# Patient Record
Sex: Male | Born: 1950 | Race: White | Hispanic: No | Marital: Married | Smoking: Never smoker
Health system: Southern US, Community
[De-identification: ages and names within clinical notes are randomized; demographics above are authoritative.]

## PROBLEM LIST (undated history)

## (undated) DIAGNOSIS — L409 Psoriasis, unspecified: Secondary | ICD-10-CM

## (undated) DIAGNOSIS — N210 Calculus in bladder: Secondary | ICD-10-CM

## (undated) DIAGNOSIS — F419 Anxiety disorder, unspecified: Secondary | ICD-10-CM

## (undated) HISTORY — PX: OTHER SURGICAL HISTORY: SHX169

## (undated) SURGERY — Surgical Case
Anesthesia: *Unknown

---

## 1996-09-12 HISTORY — PX: LUMBAR LAMINECTOMY: SHX95

## 1999-04-02 ENCOUNTER — Encounter: Payer: Self-pay | Admitting: Neurological Surgery

## 1999-04-06 ENCOUNTER — Encounter: Payer: Self-pay | Admitting: Neurological Surgery

## 1999-04-06 ENCOUNTER — Ambulatory Visit (HOSPITAL_COMMUNITY): Admission: RE | Admit: 1999-04-06 | Discharge: 1999-04-07 | Payer: Self-pay | Admitting: Neurological Surgery

## 1999-09-13 HISTORY — PX: CERVICAL FUSION: SHX112

## 2000-07-26 ENCOUNTER — Emergency Department (HOSPITAL_COMMUNITY): Admission: EM | Admit: 2000-07-26 | Discharge: 2000-07-26 | Payer: Self-pay | Admitting: Emergency Medicine

## 2000-12-04 ENCOUNTER — Encounter: Payer: Self-pay | Admitting: Neurological Surgery

## 2000-12-04 ENCOUNTER — Observation Stay (HOSPITAL_COMMUNITY): Admission: RE | Admit: 2000-12-04 | Discharge: 2000-12-04 | Payer: Self-pay | Admitting: Neurological Surgery

## 2000-12-21 ENCOUNTER — Encounter: Payer: Self-pay | Admitting: Neurological Surgery

## 2000-12-21 ENCOUNTER — Encounter: Admission: RE | Admit: 2000-12-21 | Discharge: 2000-12-21 | Payer: Self-pay | Admitting: Neurological Surgery

## 2001-01-24 ENCOUNTER — Encounter: Admission: RE | Admit: 2001-01-24 | Discharge: 2001-01-24 | Payer: Self-pay | Admitting: Internal Medicine

## 2001-01-24 ENCOUNTER — Encounter: Payer: Self-pay | Admitting: Internal Medicine

## 2005-01-28 ENCOUNTER — Encounter: Admission: RE | Admit: 2005-01-28 | Discharge: 2005-01-28 | Payer: Self-pay | Admitting: Internal Medicine

## 2007-07-12 ENCOUNTER — Emergency Department (HOSPITAL_COMMUNITY): Admission: EM | Admit: 2007-07-12 | Discharge: 2007-07-12 | Payer: Self-pay | Admitting: Emergency Medicine

## 2007-07-15 ENCOUNTER — Emergency Department (HOSPITAL_COMMUNITY): Admission: EM | Admit: 2007-07-15 | Discharge: 2007-07-15 | Payer: Self-pay | Admitting: Family Medicine

## 2007-09-13 HISTORY — PX: ROTATOR CUFF REPAIR: SHX139

## 2008-08-05 ENCOUNTER — Ambulatory Visit: Payer: Self-pay | Admitting: Internal Medicine

## 2008-08-20 ENCOUNTER — Ambulatory Visit: Payer: Self-pay | Admitting: Internal Medicine

## 2008-08-20 ENCOUNTER — Encounter: Payer: Self-pay | Admitting: Internal Medicine

## 2008-08-21 ENCOUNTER — Encounter: Payer: Self-pay | Admitting: Internal Medicine

## 2011-01-28 NOTE — Op Note (Signed)
[place]. [facility]  Patient:    Jeffrey Rosario, Jeffrey Rosario                    MRN: [id number] Proc. Date: 12/04/00 Adm. Date:  [id number] Disc. 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  OPERATION PERFORMED:  Anterior cervical diskectomy and arthrodesis, C6-C7, Synthes fixation, removal of previously placed C4 to C7 Synthes hardware.  SURGEON:  Stefani Dama, M.D.  ANESTHESIA:  General endotracheal.  INDICATIONS FOR PROCEDURE:  The patient is a 60 year old individual who has had significant neck, shoulder and left arm pain.  He has a herniated nucleus pulposus and spondylitic disease at C6-7 that has developed over a two-year period after he underwent anterior diskectomy and arthrodesis from C4 to C7. The patient has not responded to conservative management.  DESCRIPTION OF PROCEDURE:  The patient was brought to the operating room supine on the stretcher.  After smooth induction of general endotracheal anesthesia, he was placed in 5 pounds of halter traction.  The neck was shaved, prepped and draped in a sterile fashion.  A transverse incision was made at the base of his neck overlying C6-7 area and the dissection was carried down through the platysma.  The plane between the sternocleidomastoid and the strap muscles was dissected bluntly until the prevertebral space was reached.  The first identifiable disk space was noted to be that of C6-7 at the lower edge of the plate.  The plate was then dissected free and the six locking screws were removed.  The screws into the vertebrae were then removed and the plate was removed.  The soft tissues were checked for hemostasis and once this was  ascertained, the C6-7 area was addressed with removal of the disk anteriorly after removal of some ventral osteophytes with a Leksell rongeur.  The disk space was entered and opened fully with a 15 blade and then a combination of Kerrison rongeurs was used to evacuate the disk space until the posterior longitudinal ligament was reached.  Here the Anspach drill and a 2.3 mm dissecting tool were used to remove some small osteophytes and particularly prominent left-sided uncinate spurs were removed.  Some fragments of disk were identified to be herniated both on the left side and on the right side at the C6-7 level.  Once these were decompressed, the foramina were widely opened.  Hemostasis was achieved from the epidural venous area with some pledgets of Gelfoam soaked in thrombin which were later removed.  An 8 mm round fibular graft was placed in the interspace after being packed with some of the patients own autologous bone from the bone spurs.  Hemostasis was then checked in the soft tissues and lateral recesses and then a 22 mm Synthes plate was affixed to the ventral aspect of the vertebral bodies with four locking 4 x 14 mm screws.  Radiographic confirmation of placement of the hardware was obtained and once hemostasis was obtained in the soft tissues and the area was copiously irrigated with antibiotic irrigating solution, the platysma was closed with 3-0 Vicryl suture in interrupted fashion and the subcuticular tissue was closed with 3-0 Vicryl also.  The  patient tolerated the procedure well and was returned to the recovery room in stable condition. Blood loss less than 100 cc. DD:  12/04/00 TD:  12/05/00 Job: [id number]

## 2011-06-21 LAB — POCT URINALYSIS DIP (DEVICE)
Nitrite: NEGATIVE
Protein, ur: 30 — AB
Specific Gravity, Urine: 1.02
Urobilinogen, UA: 0.2
pH: 5.5

## 2011-06-22 LAB — POCT URINALYSIS DIP (DEVICE)
Nitrite: NEGATIVE
Protein, ur: 100 — AB
Specific Gravity, Urine: 1.02
Urobilinogen, UA: 0.2
pH: 5

## 2012-08-03 ENCOUNTER — Telehealth: Payer: Self-pay

## 2012-08-03 NOTE — Telephone Encounter (Signed)
Message copied by Chrystie Nose on Fri Aug 03, 2012 11:17 AM ------      Message from: Hilarie Fredrickson      Created: Fri Aug 03, 2012 11:00 AM      Regarding: recall colonoscopy       Bonita Quin, I received a call from the patient's primary care physician, Dr. Elmore Guise. He inquiring regarding the proper colonoscopy followup interval. The patient had a tubulovillous adenoma and a family history of colon cancer. The proper recall is at this time (December 2012 actually) as opposed to 5 years as originally recommended. Please send the patient a colonoscopy recall letter and this time. Thank you. Convert this to a phone note for the records.

## 2012-08-03 NOTE — Telephone Encounter (Signed)
Recall letter sent to pt.  

## 2012-08-13 ENCOUNTER — Telehealth: Payer: Self-pay | Admitting: Internal Medicine

## 2012-08-13 NOTE — Telephone Encounter (Signed)
Pt calling to schedule Colon, needs to be called back when Feb schedule opens up. Pt wants earliest AM appt available.

## 2012-08-29 NOTE — Telephone Encounter (Signed)
Pt scheduled for previsit 10/12/12@8am , colon scheduled with Dr. Marina Goodell 10/19/12@8am . Left message on voicemail for pt to call back.

## 2012-08-30 NOTE — Telephone Encounter (Signed)
Pt could not make the 10/19/12 appt. Colon appt moved to 11/09/12@8am . Pt aware of appt dates and times.

## 2012-09-12 HISTORY — PX: COLONOSCOPY: SHX174

## 2012-10-12 ENCOUNTER — Ambulatory Visit (AMBULATORY_SURGERY_CENTER): Payer: 59 | Admitting: *Deleted

## 2012-10-12 ENCOUNTER — Encounter: Payer: Self-pay | Admitting: Internal Medicine

## 2012-10-12 VITALS — Ht 74.0 in | Wt 173.6 lb

## 2012-10-12 DIAGNOSIS — Z800 Family history of malignant neoplasm of digestive organs: Secondary | ICD-10-CM

## 2012-10-12 DIAGNOSIS — Z1211 Encounter for screening for malignant neoplasm of colon: Secondary | ICD-10-CM

## 2012-10-12 MED ORDER — MOVIPREP 100 G PO SOLR: ORAL | Status: DC

## 2012-10-19 ENCOUNTER — Encounter: Payer: Self-pay | Admitting: Internal Medicine

## 2012-11-09 ENCOUNTER — Encounter: Payer: Self-pay | Admitting: Internal Medicine

## 2012-11-09 ENCOUNTER — Ambulatory Visit (AMBULATORY_SURGERY_CENTER): Payer: 59 | Admitting: Internal Medicine

## 2012-11-09 VITALS — BP 118/63 | HR 59 | Temp 96.3°F | Resp 14 | Ht 74.0 in | Wt 173.0 lb

## 2012-11-09 DIAGNOSIS — Z1211 Encounter for screening for malignant neoplasm of colon: Secondary | ICD-10-CM

## 2012-11-09 DIAGNOSIS — Z800 Family history of malignant neoplasm of digestive organs: Secondary | ICD-10-CM

## 2012-11-09 DIAGNOSIS — Z86010 Personal history of colonic polyps: Secondary | ICD-10-CM

## 2012-11-09 MED ORDER — SODIUM CHLORIDE 0.9 % IV SOLN: 500.0000 mL | INTRAVENOUS | Status: DC

## 2012-11-09 NOTE — Op Note (Signed)
[facility] [address] [place], [address]   COLONOSCOPY PROCEDURE REPORT  PATIENT: Jeffrey Rosario, Jeffrey Rosario  MR#: [id number] BIRTHDATE: [birth year] , 61  yrs. old GENDER: Male ENDOSCOPIST: Roxy Cedar, MD REFERRED [id number] Program Recall PROCEDURE DATE:  11/09/2012 PROCEDURE:   Colonoscopy, surveillance ASA CLASS:   Class II INDICATIONS:Patient's immediate family history of colon cancer and Patient's personal history of adenomatous colon polyps; index exam December 2009 with tubulovillous adenoma and adenoma. MEDICATIONS: MAC sedation, administered by CRNA and propofol (Diprivan) 270mg  IV  DESCRIPTION OF PROCEDURE:   After the risks benefits and alternatives of the procedure were thoroughly explained, informed consent was obtained.  A digital rectal exam revealed no abnormalities of the rectum.   The LB CF-[id number]  endoscope was introduced through the anus and advanced to the cecum, which was identified by both the appendix and ileocecal valve. No adverse events experienced.   The quality of the prep was excellent, using MoviPrep  The instrument was then slowly withdrawn as the colon was fully examined.      COLON FINDINGS: A normal appearing cecum, ileocecal valve, and appendiceal orifice were identified.  The ascending, hepatic flexure, transverse, splenic flexure, descending, sigmoid colon and rectum appeared unremarkable.  No polyps or cancers were seen. Retroflexed views revealed internal hemorrhoids. The time to cecum=2 minutes 28 seconds.  Withdrawal time=9 minutes 59 seconds. The scope was withdrawn and the procedure completed. COMPLICATIONS: There were no complications.  ENDOSCOPIC IMPRESSION: Normal colon  RECOMMENDATIONS: Follow up colonoscopy in 5 years   eSigned:  Roxy Cedar, MD 11/09/2012 8:43 AM   cc: Nila Nephew, MD and The Patient

## 2012-11-09 NOTE — Patient Instructions (Addendum)
YOU HAD AN ENDOSCOPIC PROCEDURE TODAY AT THE [facility]: Refer to the procedure report that was given to you for any specific questions about what was found during the examination.  If the procedure report does not answer your questions, please call your gastroenterologist to clarify.  If you requested that your care partner not be given the details of your procedure findings, then the procedure report has been included in a sealed envelope for you to review at your convenience later.  YOU SHOULD EXPECT: Some feelings of bloating in the abdomen. Passage of more gas than usual.  Walking can help get rid of the air that was put into your GI tract during the procedure and reduce the bloating. If you had a lower endoscopy (such as a colonoscopy or flexible sigmoidoscopy) you may notice spotting of blood in your stool or on the toilet paper. If you underwent a bowel prep for your procedure, then you may not have a normal bowel movement for a few days.  DIET: Your first meal following the procedure should be a light meal and then it is ok to progress to your normal diet.  A half-sandwich or bowl of soup is an example of a good first meal.  Heavy or fried foods are harder to digest and may make you feel nauseous or bloated.  Likewise meals heavy in dairy and vegetables can cause extra gas to form and this can also increase the bloating.  Drink plenty of fluids but you should avoid alcoholic beverages for 24 hours.  ACTIVITY: Your care partner should take you home directly after the procedure.  You should plan to take it easy, moving slowly for the rest of the day.  You can resume normal activity the day after the procedure however you should NOT DRIVE or use heavy machinery for 24 hours (because of the sedation medicines used during the test).    SYMPTOMS TO REPORT IMMEDIATELY: A gastroenterologist can be reached at any hour.  During normal business hours, 8:30 AM to 5:00 PM Monday through Friday,  call [phone].  After hours and on weekends, please call the GI answering service at [phone] who will take a message and have the physician on call contact you.   Following lower endoscopy (colonoscopy or flexible sigmoidoscopy):  Excessive amounts of blood in the stool  Significant tenderness or worsening of abdominal pains  Swelling of the abdomen that is new, acute  Fever of 100F or higher  FOLLOW UP: If any biopsies were taken you will be contacted by phone or by letter within the next 1-3 weeks.  Call your gastroenterologist if you have not heard about the biopsies in 3 weeks.  Our staff will call the home number listed on your records the next business day following your procedure to check on you and address any questions or concerns that you may have at that time regarding the information given to you following your procedure. This is a courtesy call and so if there is no answer at the home number and we have not heard from you through the emergency physician on call, we will assume that you have returned to your regular daily activities without incident.  Thank-you for choosing [place] for your healthcare needs.,  SIGNATURES/CONFIDENTIALITY: You and/or your care partner have signed paperwork which will be entered into your electronic medical record.  These signatures attest to the fact that that the information above on your After Visit Summary has been reviewed and is  understood.  Full responsibility of the confidentiality of this discharge information lies with you and/or your care-partner. 

## 2012-11-09 NOTE — Progress Notes (Signed)
Patient did not have preoperative order for IV antibiotic SSI prophylaxis. (G8918)  Patient did not experience any of the following events: a burn prior to discharge; a fall within the facility; wrong site/side/patient/procedure/implant event; or a hospital transfer or hospital admission upon discharge from the facility. (G8907)  

## 2012-11-12 ENCOUNTER — Telehealth: Payer: Self-pay

## 2012-11-12 NOTE — Telephone Encounter (Signed)
  Follow up Call-  Call back number 11/09/2012  Post procedure Call Back phone  # [phone]  Permission to leave phone message Yes     Patient questions:  Do you have a fever, pain , or abdominal swelling? no Pain Score  0 *  Have you tolerated food without any problems? yes  Have you been able to return to your normal activities? yes  Do you have any questions about your discharge instructions: Diet   no Medications  no Follow up visit  no  Do you have questions or concerns about your Care? no  Actions: * If pain score is 4 or above: No action needed, pain <4.   No problems per the pt.  Maw

## 2012-11-13 ENCOUNTER — Telehealth: Payer: Self-pay

## 2012-11-13 NOTE — Telephone Encounter (Signed)
Entered in error

## 2013-01-24 ENCOUNTER — Other Ambulatory Visit: Payer: Self-pay | Admitting: Neurological Surgery

## 2013-01-24 DIAGNOSIS — M47816 Spondylosis without myelopathy or radiculopathy, lumbar region: Secondary | ICD-10-CM

## 2013-01-29 ENCOUNTER — Ambulatory Visit
Admission: RE | Admit: 2013-01-29 | Discharge: 2013-01-29 | Disposition: A | Discharge status: Home / Self Care | Payer: 59 | Source: Ambulatory Visit | Attending: Neurological Surgery | Admitting: Neurological Surgery

## 2013-01-29 DIAGNOSIS — M47816 Spondylosis without myelopathy or radiculopathy, lumbar region: Secondary | ICD-10-CM

## 2013-01-29 MED ORDER — GADOBENATE DIMEGLUMINE 529 MG/ML IV SOLN
16.0000 mL | Freq: Once | INTRAVENOUS | Status: AC | PRN
  Administered 2013-01-29: 16 mL via INTRAVENOUS

## 2013-05-16 ENCOUNTER — Other Ambulatory Visit: Payer: Self-pay | Admitting: Dermatology

## 2014-08-11 ENCOUNTER — Other Ambulatory Visit: Payer: Self-pay | Admitting: Dermatology

## 2014-10-31 ENCOUNTER — Other Ambulatory Visit: Payer: Self-pay | Admitting: Dermatology

## 2015-04-17 ENCOUNTER — Encounter: Payer: Self-pay | Admitting: Internal Medicine

## 2016-04-19 ENCOUNTER — Ambulatory Visit
Admission: RE | Admit: 2016-04-19 | Discharge: 2016-04-19 | Disposition: A | Discharge status: Home / Self Care | Payer: 59 | Source: Ambulatory Visit | Attending: Internal Medicine | Admitting: Internal Medicine

## 2016-04-19 ENCOUNTER — Other Ambulatory Visit: Payer: Self-pay | Admitting: Internal Medicine

## 2016-04-19 DIAGNOSIS — R319 Hematuria, unspecified: Secondary | ICD-10-CM

## 2016-04-19 MED ORDER — IOPAMIDOL (ISOVUE-300) INJECTION 61%: 125.0000 mL | Freq: Once | INTRAVENOUS | Status: DC | PRN

## 2016-06-02 ENCOUNTER — Other Ambulatory Visit: Payer: Self-pay | Admitting: Urology

## 2016-06-02 NOTE — Progress Notes (Signed)
Has pre op appt-  Please put orders in EPIC  thanks

## 2016-06-07 ENCOUNTER — Encounter (HOSPITAL_COMMUNITY): Payer: Self-pay

## 2016-06-07 ENCOUNTER — Encounter (HOSPITAL_COMMUNITY)
Admission: RE | Admit: 2016-06-07 | Discharge: 2016-06-07 | Disposition: A | Discharge status: Home / Self Care | Payer: 59 | Source: Ambulatory Visit | Attending: Urology | Admitting: Urology

## 2016-06-07 DIAGNOSIS — R3915 Urgency of urination: Secondary | ICD-10-CM | POA: Diagnosis not present

## 2016-06-07 DIAGNOSIS — R351 Nocturia: Secondary | ICD-10-CM | POA: Diagnosis not present

## 2016-06-07 DIAGNOSIS — N210 Calculus in bladder: Secondary | ICD-10-CM | POA: Diagnosis not present

## 2016-06-07 DIAGNOSIS — N401 Enlarged prostate with lower urinary tract symptoms: Secondary | ICD-10-CM | POA: Diagnosis not present

## 2016-06-07 HISTORY — DX: Calculus in bladder: N21.0

## 2016-06-07 HISTORY — DX: Anxiety disorder, unspecified: F41.9

## 2016-06-07 LAB — CBC
HEMATOCRIT: 40.2 % (ref 39.0–52.0)
Hemoglobin: 13.6 g/dL (ref 13.0–17.0)
MCH: 30.4 pg (ref 26.0–34.0)
MCHC: 33.8 g/dL (ref 30.0–36.0)
MCV: 89.7 fL (ref 78.0–100.0)
Platelets: 196 10*3/uL (ref 150–400)
RBC: 4.48 MIL/uL (ref 4.22–5.81)
RDW: 12.4 % (ref 11.5–15.5)
WBC: 5 10*3/uL (ref 4.0–10.5)

## 2016-06-07 NOTE — Patient Instructions (Addendum)
Jeffrey Rosario  06/07/2016   Your procedure is scheduled on: Wednesday  06/08/2016  Report to [facility]  Entrance take [place]  elevators to 3rd floor to  [name] at  [phone]  AM.  Call this number if you have problems the morning of surgery [phone]   Remember: ONLY 1 PERSON MAY GO WITH YOU TO SHORT STAY TO GET  READY MORNING OF Jeffrey Rosario.   Do not eat food or drink liquids :After Midnight.     Take these medicines the morning of surgery with A SIP OF WATER: Bupropion (Wellbutrin SR)                                 You may not have any metal on your body including hair pins and              piercings  Do not wear jewelry, make-up, lotions, powders or perfumes, deodorant             Do not wear nail polish.  Do not shave  48 hours prior to surgery.              Men may shave face and neck.   Do not bring valuables to the [facility]. Jeffrey [facility].  Contacts, dentures or bridgework may not be worn into surgery.  Leave suitcase in the car. After surgery it may be brought to your room.     Patients discharged the day of surgery will not be allowed to drive home.  Name and phone number of your driver:  Special Instructions: N/A              Please read over the following fact sheets you were given: _____________________________________________________________________             Jeffrey Rosario - Preparing for Surgery Before surgery, you can play an important role.  Because skin is not sterile, your skin needs to be as free of germs as possible.  You can reduce the number of germs on your skin by washing with CHG (chlorahexidine gluconate) soap before surgery.  CHG is an antiseptic cleaner which kills germs and bonds with the skin to continue killing germs even after washing. Please DO NOT use if you have an allergy to CHG or antibacterial soaps.  If your skin becomes reddened/irritated stop using  the CHG and inform your nurse when you arrive at Short Stay. Do not shave (including legs and underarms) for at least 48 hours prior to the first CHG shower.  You may shave your face/neck. Please follow these instructions carefully:  1.  Shower with CHG Soap the night before surgery and the  morning of Surgery.  2.  If you choose to wash your hair, wash your hair first as usual with your  normal  shampoo.  3.  After you shampoo, rinse your hair and body thoroughly to remove the  shampoo.                           4.  Use CHG as you would any other liquid soap.  You can apply chg directly  to the skin and wash  Gently with a scrungie or clean washcloth.  5.  Apply the CHG Soap to your body ONLY FROM THE NECK DOWN.   Do not use on face/ open                           Wound or open sores. Avoid contact with eyes, ears mouth and genitals (private parts).                       Wash face,  Genitals (private parts) with your normal soap.             6.  Wash thoroughly, paying special attention to the area where your surgery  will be performed.  7.  Thoroughly rinse your body with warm water from the neck down.  8.  DO NOT shower/wash with your normal soap after using and rinsing off  the CHG Soap.                9.  Pat yourself dry with a clean towel.            10.  Wear clean pajamas.            11.  Place clean sheets on your bed the night of your first shower and do not  sleep with pets. Day of Surgery : Do not apply any lotions/deodorants the morning of surgery.  Please wear clean clothes to the [facility]/surgery center.  FAILURE TO FOLLOW THESE INSTRUCTIONS MAY RESULT IN THE CANCELLATION OF YOUR SURGERY PATIENT SIGNATURE_________________________________  NURSE SIGNATURE__________________________________  ________________________________________________________________________

## 2016-06-08 ENCOUNTER — Encounter (HOSPITAL_COMMUNITY)
Admission: RE | Disposition: A | Discharge status: Home / Self Care | Payer: Self-pay | Source: Ambulatory Visit | Attending: Urology

## 2016-06-08 ENCOUNTER — Ambulatory Visit (HOSPITAL_COMMUNITY)
Admission: RE | Admit: 2016-06-08 | Discharge: 2016-06-08 | Disposition: A | Discharge status: Home / Self Care | Payer: 59 | Source: Ambulatory Visit | Attending: Urology | Admitting: Urology

## 2016-06-08 ENCOUNTER — Ambulatory Visit (HOSPITAL_COMMUNITY): Payer: 59 | Admitting: Registered Nurse

## 2016-06-08 ENCOUNTER — Encounter (HOSPITAL_COMMUNITY): Payer: Self-pay | Admitting: *Deleted

## 2016-06-08 DIAGNOSIS — N210 Calculus in bladder: Secondary | ICD-10-CM | POA: Insufficient documentation

## 2016-06-08 DIAGNOSIS — N401 Enlarged prostate with lower urinary tract symptoms: Secondary | ICD-10-CM | POA: Insufficient documentation

## 2016-06-08 DIAGNOSIS — R351 Nocturia: Secondary | ICD-10-CM | POA: Insufficient documentation

## 2016-06-08 DIAGNOSIS — R3915 Urgency of urination: Secondary | ICD-10-CM | POA: Insufficient documentation

## 2016-06-08 HISTORY — PX: CYSTOSCOPY WITH LITHOLAPAXY: SHX1425

## 2016-06-08 HISTORY — PX: HOLMIUM LASER APPLICATION: SHX5852

## 2016-06-08 SURGERY — CYSTOSCOPY, WITH BLADDER CALCULUS LITHOLAPAXY
Anesthesia: General

## 2016-06-08 MED ORDER — ONDANSETRON HCL 4 MG/2ML IJ SOLN
INTRAMUSCULAR | Status: AC
  Filled 2016-06-08: qty 2

## 2016-06-08 MED ORDER — PROPOFOL 10 MG/ML IV BOLUS
INTRAVENOUS | Status: AC
  Filled 2016-06-08: qty 20

## 2016-06-08 MED ORDER — LIDOCAINE 2% (20 MG/ML) 5 ML SYRINGE
INTRAMUSCULAR | Status: DC | PRN
  Administered 2016-06-08: 100 mg via INTRAVENOUS

## 2016-06-08 MED ORDER — GLYCOPYRROLATE 0.2 MG/ML IJ SOLN
INTRAMUSCULAR | Status: DC | PRN
  Administered 2016-06-08: 0.2 mg via INTRAVENOUS

## 2016-06-08 MED ORDER — DEXAMETHASONE SODIUM PHOSPHATE 10 MG/ML IJ SOLN
INTRAMUSCULAR | Status: AC
  Filled 2016-06-08: qty 1

## 2016-06-08 MED ORDER — ATROPINE SULFATE 0.4 MG/ML IJ SOLN
INTRAMUSCULAR | Status: AC
  Filled 2016-06-08: qty 1

## 2016-06-08 MED ORDER — DEXAMETHASONE SODIUM PHOSPHATE 10 MG/ML IJ SOLN
INTRAMUSCULAR | Status: DC | PRN
  Administered 2016-06-08: 10 mg via INTRAVENOUS

## 2016-06-08 MED ORDER — PHENAZOPYRIDINE HCL 200 MG PO TABS: 200.0000 mg | ORAL_TABLET | Freq: Once | ORAL | Status: DC | PRN

## 2016-06-08 MED ORDER — MIDAZOLAM HCL 2 MG/2ML IJ SOLN
INTRAMUSCULAR | Status: AC
  Filled 2016-06-08: qty 2

## 2016-06-08 MED ORDER — LIDOCAINE 2% (20 MG/ML) 5 ML SYRINGE
INTRAMUSCULAR | Status: AC
  Filled 2016-06-08: qty 5

## 2016-06-08 MED ORDER — LIDOCAINE HCL 2 % EX GEL
CUTANEOUS | Status: DC | PRN
  Administered 2016-06-08: 1 via URETHRAL

## 2016-06-08 MED ORDER — TAMSULOSIN HCL 0.4 MG PO CAPS
0.4000 mg | ORAL_CAPSULE | Freq: Once | ORAL | Status: AC | PRN
  Administered 2016-06-08: 0.4 mg via ORAL
  Filled 2016-06-08: qty 1

## 2016-06-08 MED ORDER — CEFAZOLIN SODIUM-DEXTROSE 2-4 GM/100ML-% IV SOLN
INTRAVENOUS | Status: AC
  Filled 2016-06-08: qty 100

## 2016-06-08 MED ORDER — MIDAZOLAM HCL 5 MG/5ML IJ SOLN
INTRAMUSCULAR | Status: DC | PRN
  Administered 2016-06-08: 2 mg via INTRAVENOUS

## 2016-06-08 MED ORDER — HYDROMORPHONE HCL 1 MG/ML IJ SOLN: 0.2500 mg | INTRAMUSCULAR | Status: DC | PRN

## 2016-06-08 MED ORDER — SODIUM CHLORIDE 0.9 % IR SOLN
Status: DC | PRN
  Administered 2016-06-08: 17000 mL

## 2016-06-08 MED ORDER — PROPOFOL 10 MG/ML IV BOLUS
INTRAVENOUS | Status: DC | PRN
  Administered 2016-06-08: 200 mg via INTRAVENOUS

## 2016-06-08 MED ORDER — LACTATED RINGERS IV SOLN
INTRAVENOUS | Status: DC
  Administered 2016-06-08 (×3): via INTRAVENOUS

## 2016-06-08 MED ORDER — FENTANYL CITRATE (PF) 100 MCG/2ML IJ SOLN
INTRAMUSCULAR | Status: DC | PRN
  Administered 2016-06-08: 25 ug via INTRAVENOUS
  Administered 2016-06-08: 50 ug via INTRAVENOUS
  Administered 2016-06-08: 25 ug via INTRAVENOUS

## 2016-06-08 MED ORDER — CEFAZOLIN SODIUM-DEXTROSE 2-4 GM/100ML-% IV SOLN
2.0000 g | INTRAVENOUS | Status: AC
  Administered 2016-06-08: 2 g via INTRAVENOUS

## 2016-06-08 MED ORDER — GLYCOPYRROLATE 0.2 MG/ML IV SOSY
PREFILLED_SYRINGE | INTRAVENOUS | Status: AC
  Filled 2016-06-08: qty 3

## 2016-06-08 MED ORDER — OXYCODONE-ACETAMINOPHEN 5-325 MG PO TABS: 1.0000 | ORAL_TABLET | Freq: Once | ORAL | Status: DC | PRN

## 2016-06-08 MED ORDER — PROMETHAZINE HCL 25 MG/ML IJ SOLN: 6.2500 mg | INTRAMUSCULAR | Status: DC | PRN

## 2016-06-08 MED ORDER — FENTANYL CITRATE (PF) 100 MCG/2ML IJ SOLN
INTRAMUSCULAR | Status: AC
  Filled 2016-06-08: qty 2

## 2016-06-08 MED ORDER — ONDANSETRON HCL 4 MG/2ML IJ SOLN
INTRAMUSCULAR | Status: DC | PRN
  Administered 2016-06-08: 4 mg via INTRAVENOUS

## 2016-06-08 MED ORDER — LIDOCAINE HCL 2 % EX GEL
CUTANEOUS | Status: AC
  Filled 2016-06-08: qty 5

## 2016-06-08 SURGICAL SUPPLY — 15 items
BAG URO CATCHER STRL LF (MISCELLANEOUS) ×2 IMPLANT
CARTRIDGE STONEBREAK CO2 KIDNE (ELECTROSURGICAL) ×1 IMPLANT
CLOTH BEACON ORANGE TIMEOUT ST (SAFETY) ×2 IMPLANT
FIBER LASER FLEXIVA 1000 (UROLOGICAL SUPPLIES) ×1 IMPLANT
FIBER LASER FLEXIVA 365 (UROLOGICAL SUPPLIES) IMPLANT
FIBER LASER FLEXIVA 550 (UROLOGICAL SUPPLIES) IMPLANT
FIBER LASER TRAC TIP (UROLOGICAL SUPPLIES) IMPLANT
GLOVE BIOGEL M STRL SZ7.5 (GLOVE) ×2 IMPLANT
GOWN STRL REUS W/ TWL XL LVL3 (GOWN DISPOSABLE) ×1 IMPLANT
GOWN STRL REUS W/TWL XL LVL3 (GOWN DISPOSABLE) ×4 IMPLANT
MANIFOLD NEPTUNE II (INSTRUMENTS) ×2 IMPLANT
PACK CYSTO (CUSTOM PROCEDURE TRAY) ×2 IMPLANT
PROBE PNEUMATIC 1.6MM (ELECTROSURGICAL) ×1 IMPLANT
SYRINGE IRR TOOMEY STRL 70CC (SYRINGE) IMPLANT
TUBING CONNECTING 10 (TUBING) IMPLANT

## 2016-06-08 NOTE — Progress Notes (Signed)
[facility], speaking with Ashok Croon who states Dr. Louis Meckel not in office however his RN will call his cell phone to provide patient update and return call.

## 2016-06-08 NOTE — Interval H&P Note (Signed)
History and Physical Interval Note:  06/08/2016 10:27 AM  Jeffrey Rosario  has presented today for surgery, with the diagnosis of BLADDER STONES  The various methods of treatment have been discussed with the patient and family. After consideration of risks, benefits and other options for treatment, the patient has consented to  Procedure(s): CYSTOSCOPY WITH LITHOLAPAXY (N/A) HOLMIUM LASER APPLICATION (N/A) as a surgical intervention .  The patient's history has been reviewed, patient examined, no change in status, stable for surgery.  I have reviewed the patient's chart and labs.  Questions were answered to the patient's satisfaction.     Louis Meckel W

## 2016-06-08 NOTE — Progress Notes (Signed)
Spoke with Dr. Louis Meckel who gave verbal order for I/O cath to empty bladder, then discharge to home.  Advise patient to have Fosamax prescription filled today and take 1 tablet after having filled.  Instructed  patient to return to ER if unable to void tonight and call [facility] office in the morning with an update.  I/O catheter with 632mls removed.  Patient and wife verbalized understanding to take Fosamax and contact [facility] in the morning with update.  Patient and wife also verbalized understanding if patient unable to void to return to ER.

## 2016-06-08 NOTE — Progress Notes (Signed)
Patient up in room. Has attempted to urinate w/o success. Bloody drainage noted at tip of penis. Continues to drink pos. IVF reconnected and flomax given.

## 2016-06-08 NOTE — Anesthesia Postprocedure Evaluation (Signed)
Anesthesia Post Note  Patient: Jeffrey Rosario  Procedure(s) Performed: Procedure(s) (LRB): CYSTOSCOPY WITH LITHOLAPAXY (N/A) HOLMIUM LASER APPLICATION (N/A)  Patient location during evaluation: PACU Anesthesia Type: General Level of consciousness: awake Pain management: pain level controlled Vital Signs Assessment: post-procedure vital signs reviewed and stable Respiratory status: spontaneous breathing Cardiovascular status: stable Anesthetic complications: no    Last Vitals:  Vitals:   06/08/16 1227 06/08/16 1237  BP: 98/76 130/74  Pulse:  61  Resp:  16  Temp:  36.6 C    Last Pain:  Vitals:   06/08/16 1215  TempSrc:   PainSc: 0-No pain                 EDWARDS,Amina Menchaca

## 2016-06-08 NOTE — Progress Notes (Signed)
[facility], Dr. Louis Meckel not in office, [facility] Dr. Louis Meckel no longer in OR/PACU.  Paged Dr. Louis Meckel again.  Awaiting returned call.  Patient able to void small amount bloody urine.  PVR 625ml.  Patient continues to void small amounts of urine.

## 2016-06-08 NOTE — Op Note (Signed)
Preoperative Diagnosis: Bladder calculi, enlarged prostate  Postoperative Diagnosis:  Same  Procedure(s) Performed:   1. Cystourethroscopy 2. Cystolithalopaxy >2.5cm  Teaching Surgeon:  Louis Meckel, MD; Matilde Sprang, MD  Resident Surgeon:  Sharmaine Base, MD  Assistant(s):  None  Anesthesia:  General  Fluids:  See anesthesia record  Estimated blood loss:  <10 mL  Specimens:  Multiple bladder calculi stone fragments for analysis  Cultures:  None  Drains:  None  Complications:  None  Indications: 65yo male recently seen by Dr. Jeffie Pollock in the office for BPH, bladder calculi and gross hematuria. He presents today for cystolithalopaxy. He was counseled on the risks and benefits of the procedure and wishes to proceed.  Findings: Normal anterior urethroscopy. BPH with intravesical median lobe, high riding bladder neck.  Multiple smooth round light brown/yellow stones within bladder, successfully evacuated.  Description:  The patient was correctly identified in the preop holding area where written informed consent as well potential risk and complication reviewed. He agreed. They were brought back to the operative suite where a preinduction timeout was performed. Once correct information was verified, general anesthesia was induced via LMA. They were then gently placed into dorsal lithotomy position with SCDs in place for VTE prophylaxis. They were prepped and draped in the usual sterile fashion and given appropriate preoperative antibiotics. A second timeout was then performed.   We inserted a 31F rigid cystoscope per urethra with copious lubrication and normal saline irrigation running. This demonstrated findings as described above.  There were otherwise no bladder tumors or mucosal irregularities noted.  We switched to the 26Fr resectoscope for better visualization and irrigant flow. None of the multiple stones within the bladder were able to simply evacuated by bladder emptying through the  resectoscope. Using a combination of the 1000 micron holmium laser fiber with settings of 0.5J and 50Hz  initially (raised to 1J and 50Hz ) and the StoneBreaker Pneumatic Lithotripter, all stones were fragmented that could be emptied from the bladder with bladder filling & evacuation as well as with the To[place]y syringe. All stone fragments were successfully treated and evacuated from the bladder. There was minimal oozing from the bladder neck throughout the case. The bladder was left with minimal irrigant for stimulus to urinate postoperatively and all instrumentation was removed. Viscous lidocaine jelly was instilled in the urethra. He was woken up from anesthesia and taken to recovery for postoperative care.    Post Op Plan:   1. Discharge patient home when meets PACU criteria and voids x1. 2. Discharge with prescriptions for ultram & pyridium PRN. Start flomax 0.4mg  qd. 3. RTC to see Dr. Jeffie Pollock in about 2 weeks

## 2016-06-08 NOTE — Progress Notes (Signed)
Patient unable to urinate.  Bladder scan reveals 756ml.  Dr Louis Meckel paged.

## 2016-06-08 NOTE — Anesthesia Preprocedure Evaluation (Addendum)
Anesthesia Evaluation  Patient identified by MRN, date of birth, ID band Patient awake    Reviewed: Allergy & Precautions, NPO status , Patient's Chart, lab work & pertinent test results  Airway Mallampati: II  TM Distance: >3 FB     Dental   Pulmonary neg pulmonary ROS,    breath sounds clear to auscultation       Cardiovascular negative cardio ROS   Rhythm:Regular Rate:Normal     Neuro/Psych    GI/Hepatic negative GI ROS, Neg liver ROS,   Endo/Other  negative endocrine ROS  Renal/GU negative Renal ROS     Musculoskeletal   Abdominal   Peds  Hematology negative hematology ROS (+)   Anesthesia Other Findings   Reproductive/Obstetrics                             Anesthesia Physical Anesthesia Plan  ASA: III  Anesthesia Plan: General   Post-op Pain Management:    Induction: Intravenous  Airway Management Planned: LMA  Additional Equipment:   Intra-op Plan:   Post-operative Plan: Extubation in OR  Informed Consent:   Dental advisory given  Plan Discussed with: CRNA and Anesthesiologist  Anesthesia Plan Comments:        Anesthesia Quick Evaluation

## 2016-06-08 NOTE — Discharge Instructions (Signed)
General Anesthesia, Adult, Care After Refer to this sheet in the next few weeks. These instructions provide you with information on caring for yourself after your procedure. Your health care provider may also give you more specific instructions. Your treatment has been planned according to current medical practices, but problems sometimes occur. Call your health care provider if you have any problems or questions after your procedure. WHAT TO EXPECT AFTER THE PROCEDURE After the procedure, it is typical to experience: Sleepiness. Nausea and vomiting. HOME CARE INSTRUCTIONS For the first 24 hours after general anesthesia: Have a responsible person with you. Do not drive a car. If you are alone, do not take public transportation. Do not drink alcohol. Do not take medicine that has not been prescribed by your health care provider. Do not sign important papers or make important decisions. You may resume a normal diet and activities as directed by your health care provi If you have questions or problems that seem related to general anesthesia, call the hospital and ask for the anesthetist or anesthesiologist on call. SEEK MEDICAL CARE IF: You have nausea and vomiting that continue the day after anesthesia. You develop a rash. SEEK IMMEDIATE MEDICAL CARE IF:  You have difficulty breathing. You have chest pain. You have any allergic problems.   This information is not intended to replace advice given to you by your health care provider. Make sure you discuss any questions you have with your health care provider.   Document Released: 12/05/2000 Document Revised: 09/19/2014 Document Reviewed: 12/28/2011 Elsevier Interactive Patient Education 2016 [place].   Cystoscopy, Care After Refer to this sheet in the next few weeks. These instructions provide you with information on caring for yourself after your procedure. Your caregiver may also give you more specific instructions. Your treatment  has been planned according to current medical practices, but problems sometimes occur. Call your caregiver if you have any problems or questions after your procedure. HOME CARE INSTRUCTIONS  Things you can do to ease any discomfort after your procedure include:  Drinking enough water and fluids to keep your urine clear or pale yellow.  Taking a warm bath to relieve any burning feelings. SEEK IMMEDIATE MEDICAL CARE IF:   You have an increase in blood in your urine.  You notice blood clots in your urine.  You have difficulty passing urine.  You have the chills.  You have abdominal pain.  You have a fever or persistent symptoms for more than 2-3 days.  You have a fever and your symptoms suddenly get worse. MAKE SURE YOU:   Understand these instructions.  Will watch your condition.  Will get help right away if you are not doing well or get worse.   This information is not intended to replace advice given to you by your health care provider. Make sure you discuss any questions you have with your health care provider.   Document Released: 03/18/2005 Document Revised: 09/19/2014 Document Reviewed: 02/20/2012 Elsevier Interactive Patient Education [employer].

## 2016-06-08 NOTE — H&P (Signed)
Jeffrey Rosario is a 65 year-old male patient who was referred by Dr. Lance Muss, MD who is here for bladder stones.  His problem was diagnosed approximately 03/12/2016. His symptoms include blood in urine. Patient denies having flank pain, back pain, groin pain, nausea, vomiting, fever, and chills.   Jeffrey Rosario is a former patient with a history of stones who I last saw in 2008. He is sent back by Dr Zada Girt for bladder stones found on evaluation of gross hematuria. He has multiple stones with the largest about 68mm. He has BPH with some intravesical extension. He has constant urgency. He has no hesitancy. He has an adequate stream. He will have terminal dribbling. He has nocturia x 2-3. He doesn't feel he empties well. He has passed stones previously.      ALLERGIES: No Allergies    MEDICATIONS: Wellbutrin XL 150 MG Oral Tablet Extended Release 24 Hour Oral     GU PSH: None     PSH Notes: Shoulder Surgery, Back Surgery, Lower Back Surgery, Knee Surgery   NON-GU PSH: Back Surgery (Unspecified) Neck Surgery (Unspecified) Shoulder Surgery (Unspecified)    GU PMH: Bladder Stone, Bladder calculus - 2014 Hematuria, Unspec, Hematuria - 2014 Kidney Stone, Kidney stone on left side - 2014 Left lower quadrant pain, Abdominal pain, LLQ (left lower quadrant) - 2014      PMH Notes:  1898-09-12 00:00:00 - Note: Normal Routine History And Physical Adult  2007-07-16 09:47:46 - Note: Arthritis   NON-GU PMH: Personal history of other mental and behavioral disorders, History of depression - 2014 Tinea unguium, Dermatophytosis Of The Nails - 2014    FAMILY HISTORY: 1 Daughter - Other 2 sons - Other Colon Cancer - Father Death In The Family Father - Father Family Health Status Number - Father nephrolithiasis - Brother   SOCIAL HISTORY: Marital Status: Married Current Smoking Status: Patient has never smoked.  Drinks 1 drink per day.      Notes: Alcohol Use, Occupation:, Tobacco Use,  Caffeine Use, Marital History - Currently Married   REVIEW OF SYSTEMS:    GU Review Male:   Patient reports frequent urination, hard to postpone urination, and get up at night to urinate. Patient denies burning/ pain with urination, leakage of urine, stream starts and stops, trouble starting your stream, have to strain to urinate , erection problems, and penile pain.  Gastrointestinal (Upper):   Patient denies nausea, indigestion/ heartburn, and vomiting.  Gastrointestinal (Lower):   Patient denies diarrhea and constipation.  Constitutional:   Patient denies fever, night sweats, weight loss, and fatigue.  Skin:   Patient denies skin rash/ lesion and itching.  Eyes:   Patient denies blurred vision and double vision.  Ears/ Nose/ Throat:   Patient denies sore throat and sinus problems.  Hematologic/Lymphatic:   Patient denies swollen glands and easy bruising.  Cardiovascular:   Patient denies leg swelling and chest pains.  Respiratory:   Patient denies cough and shortness of breath.  Endocrine:   Patient denies excessive thirst.  Musculoskeletal:   Patient denies back pain and joint pain.  Neurological:   Patient reports headaches. Patient denies dizziness.  Psychologic:   Patient denies depression and anxiety.   VITAL SIGNS:      06/01/2016 02:04 PM  Weight 172 lb / 78.02 kg  Height 72 in / 182.88 cm  BP 156/84 mmHg  Heart Rate 84 /min  BMI 23.3 kg/m   GU PHYSICAL EXAMINATION:    Anus and Perineum: No  hemorrhoids. No anal stenosis. No rectal fissure, no anal fissure. No edema, no dimple, no perineal tenderness, no anal tenderness.  Scrotum: No lesions. No edema. No cysts. No warts.  Epididymides: Right: no spermatocele, no masses, no cysts, no tenderness, no induration, no enlargement. Left: no spermatocele, no masses, no cysts, no tenderness, no induration, no enlargement.  Testes: No tenderness, no swelling, no enlargement left testes. No tenderness, no swelling, no enlargement right  testes. Normal location left testes. Normal location right testes. No mass, no cyst, no varicocele, no hydrocele left testes. No mass, no cyst, no varicocele, no hydrocele right testes.  Urethral Meatus: Normal size. No lesion, no wart, no discharge, no polyp. Normal location.  Penis: Circumcised, no warts, no cracks. No dorsal Peyronie's plaques, no left corporal Peyronie's plaques, no right corporal Peyronie's plaques, no scarring, no warts. No balanitis, no meatal stenosis.  Prostate: Prostate 2 + size. Left lobe normal consistency, right lobe normal consistency. Symmetrical lobes. No prostate nodule. Left lobe no tenderness, right lobe no tenderness.   Seminal Vesicles: Nonpalpable.  Sphincter Tone: Normal sphincter. No rectal tenderness. No rectal mass.    MULTI-SYSTEM PHYSICAL EXAMINATION:    Constitutional: Well-nourished. No physical deformities. Normally developed. Good grooming.  Neck: Neck symmetrical, not swollen. Normal tracheal position.  Respiratory: No labored breathing, no use of accessory muscles. CTA  Cardiovascular: Normal temperature, no edema. RRR without murmur.  Lymphatic: No enlargement of neck, axillae, groin.  Skin: No paleness, no jaundice, no cyanosis. No lesion, no ulcer, no rash.  Neurologic / Psychiatric: Oriented to time, oriented to place, oriented to person. No depression, no anxiety, no agitation.  Gastrointestinal: No hernia. No mass, no tenderness, no rigidity, non obese abdomen.   Musculoskeletal: Normal gait and station of head and neck.     PAST DATA REVIEWED:  Source Of History:  Patient  Lab Test Review:   PSA  Urine Test Review:   Urinalysis  Urodynamics Review:   Review Bladder Scan   PROCEDURES:           PVR Ultrasound - [id number]  Scanned Volume: 64 cc         Urinalysis w/Scope - 81001 Dipstick Dipstick Cont'd Micro  Specimen: Voided Bilirubin: Neg WBC/hpf: 0-5/hpf  Color: Amber Ketones: Neg RBC/hpf: >60/hpf  Appearance: Cloudy Blood:  3+ Bacteria: Rare  Specific Gravity: 1.020 Protein: Trace Cystals: Amorph Urates  pH: 5.0 Urobilinogen: 0.2 Casts: NS (Not Seen)  Glucose: Neg Nitrites: Neg Trichomonas: Not Present    Leukocyte Esterase: Neg Mucous: Not Present      Epithelial Cells: 0-5/hpf      Yeast: NS (Not Seen)      Sperm: Not Present    ASSESSMENT:      ICD-10 Details  1 GU:   Bladder Stone - N21.0 He has multiple bladder stones up to 28mm in size with associated hematuria and irritative voiding symptoms.   2   Gross hematuria - R31.0   3   BPH w/LUTS - N40.1 He has prostate enlargement with intravesical extension but was voiding well until the hematuria began. His PVR is ok.   4   Urinary Urgency - R39.15    PLAN:           Orders Labs Urine Culture and Sensitivity          Schedule Return Visit: ASAP - Schedule Surgery  Procedure: 06/01/2016 at [facility] - [phone] - PVR Ultrasound (Bladder Scan / Residual Urine) - [phone]  Procedure: Unspecified Date - Cysto Bladder Stone >2.5cm [id number] Notes: Next available          Document Letter(s):  Created for Patient: Clinical Summary         Notes:   I discussed the options for therapy including cystolithalopaxy with or without TUR/Laser prostate procedure.   I reviewed the risks of the cystolithalopaxy including bleeding, infection, injury to the bladder and adjacent structures, recurrent stones, thrombotic events and anesthetic complications.   I reviewed the additional risks of the TURP/Laser prostatectomy including incontinence, strictures, fluid overload, ejaculatory dysfuntion and erectile dysfunction.   After reviewing the options, he would like to proceed with cystolithalopaxy at this time and will get that scheduled.

## 2016-06-08 NOTE — Transfer of Care (Signed)
Immediate Anesthesia Transfer of Care Note  Patient: Jeffrey Rosario  Procedure(s) Performed: Procedure(s): CYSTOSCOPY WITH LITHOLAPAXY (N/A) HOLMIUM LASER APPLICATION (N/A)  Patient Location: PACU  Anesthesia Type:General  Level of Consciousness:  sedated, patient cooperative and responds to stimulation  Airway & Oxygen Therapy:Patient Spontanous Breathing and Patient connected to face mask oxgen  Post-op Assessment:  Report given to PACU RN and Post -op Vital signs reviewed and stable  Post vital signs:  Reviewed and stable  Last Vitals:  Vitals:   06/08/16 1001  BP: (!) 155/80  Pulse: 70  Resp: 16  Temp: [id number] C    Complications: No apparent anesthesia complications

## 2016-06-08 NOTE — Anesthesia Procedure Notes (Signed)
Procedure Name: LMA Insertion Date/Time: 06/08/2016 10:57 AM Performed by: Talbot Grumbling Pre-anesthesia Checklist: Patient identified, Emergency Drugs available, Suction available and Patient being monitored Patient Re-evaluated:Patient Re-evaluated prior to inductionOxygen Delivery Method: Circle system utilized Preoxygenation: Pre-oxygenation with 100% oxygen Intubation Type: IV induction Ventilation: Mask ventilation without difficulty LMA: LMA inserted LMA Size: 4.0 Number of attempts: 1 Placement Confirmation: positive ETCO2 and breath sounds checked- equal and bilateral Tube secured with: Tape Dental Injury: Teeth and Oropharynx as per pre-operative assessment

## 2016-06-09 ENCOUNTER — Encounter (HOSPITAL_COMMUNITY): Payer: Self-pay | Admitting: Urology

## 2017-09-12 HISTORY — PX: CATARACT EXTRACTION, BILATERAL: SHX1313

## 2017-11-01 ENCOUNTER — Encounter: Payer: Self-pay | Admitting: Internal Medicine

## 2018-01-12 ENCOUNTER — Ambulatory Visit (AMBULATORY_SURGERY_CENTER): Payer: Self-pay | Admitting: *Deleted

## 2018-01-12 ENCOUNTER — Other Ambulatory Visit: Payer: Self-pay

## 2018-01-12 VITALS — Ht 73.0 in | Wt 173.0 lb

## 2018-01-12 DIAGNOSIS — Z800 Family history of malignant neoplasm of digestive organs: Secondary | ICD-10-CM

## 2018-01-12 DIAGNOSIS — Z86010 Personal history of colonic polyps: Secondary | ICD-10-CM

## 2018-01-12 MED ORDER — NA SULFATE-K SULFATE-MG SULF 17.5-3.13-1.6 GM/177ML PO SOLN: ORAL | 0 refills | Status: DC

## 2018-01-12 NOTE — Progress Notes (Signed)
Patient denies any allergies to eggs or soy. Patient denies any problems with anesthesia/sedation. Patient denies any oxygen use at home. Patient denies taking any diet/weight loss medications or blood thinners. EMMI education declined by the pt. suprep coupon given to pt.

## 2018-01-16 ENCOUNTER — Encounter: Payer: Self-pay | Admitting: Internal Medicine

## 2018-01-24 ENCOUNTER — Encounter: Payer: Self-pay | Admitting: Internal Medicine

## 2018-01-24 ENCOUNTER — Ambulatory Visit (AMBULATORY_SURGERY_CENTER): Payer: 59 | Admitting: Internal Medicine

## 2018-01-24 ENCOUNTER — Other Ambulatory Visit: Payer: Self-pay

## 2018-01-24 VITALS — BP 113/83 | HR 50 | Temp 97.5°F | Resp 11 | Ht 73.0 in | Wt 173.0 lb

## 2018-01-24 DIAGNOSIS — D123 Benign neoplasm of transverse colon: Secondary | ICD-10-CM

## 2018-01-24 DIAGNOSIS — D122 Benign neoplasm of ascending colon: Secondary | ICD-10-CM

## 2018-01-24 DIAGNOSIS — Z86010 Personal history of colonic polyps: Secondary | ICD-10-CM

## 2018-01-24 MED ORDER — SODIUM CHLORIDE 0.9 % IV SOLN: 500.0000 mL | Freq: Once | INTRAVENOUS | Status: DC

## 2018-01-24 NOTE — Progress Notes (Signed)
Report given to PACU, vss 

## 2018-01-24 NOTE — Progress Notes (Signed)
Pt's states no medical or surgical changes since previsit or office visit.  No egg or soy allergy  

## 2018-01-24 NOTE — Op Note (Signed)
[place] Patient Name: Jeffrey Rosario Procedure Date: 01/24/2018 9:19 AM MRN: [id number] Endoscopist: Docia Chuck. Henrene Pastor , MD Age: 67 Referring MD:  Date of Birth: 02-20-1951 Gender: Male Account #: [id number] Procedure:                Colonoscopy, With cold snare polypectomy x 2 Indications:              High risk colon cancer surveillance: Personal                            history of adenoma with villous component, High                            risk colon cancer surveillance: Personal history of                            non-advanced adenoma. Previous examinations 2004,                            2009, 2014. Father with colon cancer greater than                            age 75 Medicines:                Monitored Anesthesia Care Procedure:                Pre-Anesthesia Assessment:                           - Prior to the procedure, a History and Physical                            was performed, and patient medications and                            allergies were reviewed. The patient's tolerance of                            previous anesthesia was also reviewed. The risks                            and benefits of the procedure and the sedation                            options and risks were discussed with the patient.                            All questions were answered, and informed consent                            was obtained. Prior Anticoagulants: The patient has                            taken no previous anticoagulant or antiplatelet  agents. ASA Grade Assessment: II - A patient with                            mild systemic disease. After reviewing the risks                            and benefits, the patient was deemed in                            satisfactory condition to undergo the procedure.                           After obtaining informed consent, the colonoscope                            was passed under direct  vision. Throughout the                            procedure, the patient's blood pressure, pulse, and                            oxygen saturations were monitored continuously. The                            Colonoscope was introduced through the anus and                            advanced to the the cecum, identified by                            appendiceal orifice and ileocecal valve. The                            ileocecal valve, appendiceal orifice, and rectum                            were photographed. The quality of the bowel                            preparation was excellent. The colonoscopy was                            performed without difficulty. The patient tolerated                            the procedure well. The bowel preparation used was                            SUPREP. Scope In: 9:33:04 AM Scope Out: 9:47:55 AM Scope Withdrawal Time: 0 hours 12 minutes 59 seconds  Total Procedure Duration: 0 hours 14 minutes 51 seconds  Findings:                 Two polyps were found in the hepatic flexure and  proximal ascending colon. The polyps were 2 to 6 mm                            in size. These polyps were removed with a cold                            snare. Resection and retrieval were complete.                           Internal hemorrhoids were found during retroflexion.                           The exam was otherwise without abnormality on                            direct and retroflexion views. Complications:            No immediate complications. Estimated blood loss:                            None. Estimated Blood Loss:     Estimated blood loss: none. Impression:               - Two 2 to 6 mm polyps at the hepatic flexure and                            in the proximal ascending colon, removed with a                            cold snare. Resected and retrieved.                           - Internal hemorrhoids.                            - The examination was otherwise normal on direct                            and retroflexion views. Recommendation:           - Repeat colonoscopy in 5 years for surveillance.                           - Patient has a contact number available for                            emergencies. The signs and symptoms of potential                            delayed complications were discussed with the                            patient. Return to normal activities tomorrow.  Written discharge instructions were provided to the                            patient.                           - Resume previous diet.                           - Continue present medications.                           - Await pathology results. Docia Chuck. Henrene Pastor, MD 01/24/2018 10:00:43 AM This report has been signed electronically.

## 2018-01-24 NOTE — Patient Instructions (Signed)
Discharge instructions given. Handouts on polyps and hemorrhoids. Resume previous medications. YOU HAD AN ENDOSCOPIC PROCEDURE TODAY AT THE [facility]:   Refer to the procedure report that was given to you for any specific questions about what was found during the examination.  If the procedure report does not answer your questions, please call your gastroenterologist to clarify.  If you requested that your care partner not be given the details of your procedure findings, then the procedure report has been included in a sealed envelope for you to review at your convenience later.  YOU SHOULD EXPECT: Some feelings of bloating in the abdomen. Passage of more gas than usual.  Walking can help get rid of the air that was put into your GI tract during the procedure and reduce the bloating. If you had a lower endoscopy (such as a colonoscopy or flexible sigmoidoscopy) you may notice spotting of blood in your stool or on the toilet paper. If you underwent a bowel prep for your procedure, you may not have a normal bowel movement for a few days.  Please Note:  You might notice some irritation and congestion in your nose or some drainage.  This is from the oxygen used during your procedure.  There is no need for concern and it should clear up in a day or so.  SYMPTOMS TO REPORT IMMEDIATELY:   Following lower endoscopy (colonoscopy or flexible sigmoidoscopy):  Excessive amounts of blood in the stool  Significant tenderness or worsening of abdominal pains  Swelling of the abdomen that is new, acute  Fever of 100F or higher   For urgent or emergent issues, a gastroenterologist can be reached at any hour by calling [phone].   DIET:  We do recommend a small meal at first, but then you may proceed to your regular diet.  Drink plenty of fluids but you should avoid alcoholic beverages for 24 hours.  ACTIVITY:  You should plan to take it easy for the rest of today and you should NOT DRIVE  or use heavy machinery until tomorrow (because of the sedation medicines used during the test).    FOLLOW UP: Our staff will call the number listed on your records the next business day following your procedure to check on you and address any questions or concerns that you may have regarding the information given to you following your procedure. If we do not reach you, we will leave a message.  However, if you are feeling well and you are not experiencing any problems, there is no need to return our call.  We will assume that you have returned to your regular daily activities without incident.  If any biopsies were taken you will be contacted by phone or by letter within the next 1-3 weeks.  Please call us at [phone] if you have not heard about the biopsies in 3 weeks.    SIGNATURES/CONFIDENTIALITY: You and/or your care partner have signed paperwork which will be entered into your electronic medical record.  These signatures attest to the fact that that the information above on your After Visit Summary has been reviewed and is understood.  Full responsibility of the confidentiality of this discharge information lies with you and/or your care-partner. 

## 2018-01-24 NOTE — Progress Notes (Signed)
Called to room to assist during endoscopic procedure.  Patient ID and intended procedure confirmed with present staff. Received instructions for my participation in the procedure from the performing physician.  

## 2018-01-25 ENCOUNTER — Telehealth: Payer: Self-pay | Admitting: *Deleted

## 2018-01-25 NOTE — Telephone Encounter (Signed)
  Follow up Call-  Call back number 01/24/2018  Post procedure Call Back phone  # [phone]  Permission to leave phone message Yes  Some recent data might be hidden     Patient questions:  Message left to call us if necessary.  [place]  [place] and the phone is just VM. No need to recall.

## 2018-01-29 ENCOUNTER — Encounter: Payer: Self-pay | Admitting: Internal Medicine

## 2019-05-28 ENCOUNTER — Other Ambulatory Visit: Payer: Self-pay | Admitting: Urology

## 2019-06-05 ENCOUNTER — Other Ambulatory Visit: Payer: Self-pay

## 2019-06-05 ENCOUNTER — Encounter (HOSPITAL_BASED_OUTPATIENT_CLINIC_OR_DEPARTMENT_OTHER): Payer: Self-pay | Admitting: *Deleted

## 2019-06-05 NOTE — Progress Notes (Signed)
Spoke with patient via telephone for pre op interview. NPO after MN. No medications AM of surgery. Arrival time 0530.  

## 2019-06-06 ENCOUNTER — Other Ambulatory Visit (HOSPITAL_COMMUNITY)
Admission: RE | Admit: 2019-06-06 | Discharge: 2019-06-06 | Disposition: A | Discharge status: Home / Self Care | Payer: Medicare HMO | Source: Ambulatory Visit | Attending: Urology | Admitting: Urology

## 2019-06-06 DIAGNOSIS — Z01812 Encounter for preprocedural laboratory examination: Secondary | ICD-10-CM | POA: Insufficient documentation

## 2019-06-06 DIAGNOSIS — Z20828 Contact with and (suspected) exposure to other viral communicable diseases: Secondary | ICD-10-CM | POA: Diagnosis not present

## 2019-06-07 LAB — NOVEL CORONAVIRUS, NAA (HOSP ORDER, SEND-OUT TO REF LAB; TAT 18-24 HRS): SARS-CoV-2, NAA: NOT DETECTED

## 2019-06-09 NOTE — Anesthesia Preprocedure Evaluation (Addendum)
Anesthesia Evaluation  Patient identified by MRN, date of birth, ID band Patient awake    Reviewed: Allergy & Precautions, NPO status , Patient's Chart, lab work & pertinent test results  Airway Mallampati: II  TM Distance: >3 FB Neck ROM: Full    Dental no notable dental hx.    Pulmonary neg pulmonary ROS,    Pulmonary exam normal breath sounds clear to auscultation       Cardiovascular negative cardio ROS Normal cardiovascular exam Rhythm:Regular Rate:Normal     Neuro/Psych Anxiety negative neurological ROS     GI/Hepatic negative GI ROS, Neg liver ROS,   Endo/Other  negative endocrine ROS  Renal/GU negative Renal ROS     Musculoskeletal negative musculoskeletal ROS (+)   Abdominal   Peds  Hematology negative hematology ROS (+)   Anesthesia Other Findings BENIGN PROSTATIC HYPERPLASIA, BLADDER STONE  Reproductive/Obstetrics                            Anesthesia Physical Anesthesia Plan  ASA: II  Anesthesia Plan: General   Post-op Pain Management:    Induction: Intravenous  PONV Risk Score and Plan: 3 and Ondansetron, Dexamethasone, Treatment may vary due to age or medical condition and Midazolam  Airway Management Planned: LMA  Additional Equipment:   Intra-op Plan:   Post-operative Plan: Extubation in OR  Informed Consent: I have reviewed the patients History and Physical, chart, labs and discussed the procedure including the risks, benefits and alternatives for the proposed anesthesia with the patient or authorized representative who has indicated his/her understanding and acceptance.     Dental advisory given  Plan Discussed with: CRNA  Anesthesia Plan Comments:       Anesthesia Quick Evaluation

## 2019-06-10 ENCOUNTER — Ambulatory Visit (HOSPITAL_BASED_OUTPATIENT_CLINIC_OR_DEPARTMENT_OTHER): Payer: Medicare HMO | Admitting: Anesthesiology

## 2019-06-10 ENCOUNTER — Encounter (HOSPITAL_BASED_OUTPATIENT_CLINIC_OR_DEPARTMENT_OTHER): Payer: Self-pay

## 2019-06-10 ENCOUNTER — Encounter (HOSPITAL_BASED_OUTPATIENT_CLINIC_OR_DEPARTMENT_OTHER)
Admission: RE | Disposition: A | Discharge status: Home / Self Care | Payer: Self-pay | Source: Home / Self Care | Attending: Urology

## 2019-06-10 ENCOUNTER — Ambulatory Visit (HOSPITAL_BASED_OUTPATIENT_CLINIC_OR_DEPARTMENT_OTHER)
Admission: RE | Admit: 2019-06-10 | Discharge: 2019-06-10 | Disposition: A | Discharge status: Home / Self Care | Payer: Medicare HMO | Attending: Urology | Admitting: Urology

## 2019-06-10 DIAGNOSIS — N401 Enlarged prostate with lower urinary tract symptoms: Secondary | ICD-10-CM | POA: Diagnosis not present

## 2019-06-10 DIAGNOSIS — N210 Calculus in bladder: Secondary | ICD-10-CM | POA: Insufficient documentation

## 2019-06-10 DIAGNOSIS — N138 Other obstructive and reflux uropathy: Secondary | ICD-10-CM | POA: Insufficient documentation

## 2019-06-10 HISTORY — PX: CYSTOSCOPY WITH LITHOLAPAXY: SHX1425

## 2019-06-10 HISTORY — DX: Psoriasis, unspecified: L40.9

## 2019-06-10 HISTORY — PX: CYSTOSCOPY WITH INSERTION OF UROLIFT: SHX6678

## 2019-06-10 HISTORY — PX: HOLMIUM LASER APPLICATION: SHX5852

## 2019-06-10 SURGERY — CYSTOSCOPY, WITH BLADDER CALCULUS LITHOLAPAXY
Anesthesia: General

## 2019-06-10 MED ORDER — LIDOCAINE 2% (20 MG/ML) 5 ML SYRINGE
INTRAMUSCULAR | Status: AC
  Filled 2019-06-10: qty 5

## 2019-06-10 MED ORDER — ACETAMINOPHEN 500 MG PO TABS
ORAL_TABLET | ORAL | Status: AC
  Filled 2019-06-10: qty 2

## 2019-06-10 MED ORDER — LACTATED RINGERS IV SOLN
INTRAVENOUS | Status: DC
  Administered 2019-06-10 (×2): via INTRAVENOUS
  Filled 2019-06-10: qty 1000

## 2019-06-10 MED ORDER — ACETAMINOPHEN 500 MG PO TABS
1000.0000 mg | ORAL_TABLET | Freq: Once | ORAL | Status: AC
  Administered 2019-06-10: 1000 mg via ORAL
  Filled 2019-06-10: qty 2

## 2019-06-10 MED ORDER — GLYCOPYRROLATE PF 0.2 MG/ML IJ SOSY
PREFILLED_SYRINGE | INTRAMUSCULAR | Status: AC
  Filled 2019-06-10: qty 1

## 2019-06-10 MED ORDER — PROPOFOL 10 MG/ML IV BOLUS
INTRAVENOUS | Status: AC
  Filled 2019-06-10: qty 20

## 2019-06-10 MED ORDER — MIDAZOLAM HCL 5 MG/5ML IJ SOLN
INTRAMUSCULAR | Status: DC | PRN
  Administered 2019-06-10: 2 mg via INTRAVENOUS

## 2019-06-10 MED ORDER — KETOROLAC TROMETHAMINE 30 MG/ML IJ SOLN
INTRAMUSCULAR | Status: AC
  Filled 2019-06-10: qty 1

## 2019-06-10 MED ORDER — FENTANYL CITRATE (PF) 100 MCG/2ML IJ SOLN
INTRAMUSCULAR | Status: DC | PRN
  Administered 2019-06-10 (×2): 50 ug via INTRAVENOUS

## 2019-06-10 MED ORDER — FENTANYL CITRATE (PF) 100 MCG/2ML IJ SOLN
25.0000 ug | INTRAMUSCULAR | Status: DC | PRN
  Filled 2019-06-10: qty 1

## 2019-06-10 MED ORDER — MIDAZOLAM HCL 2 MG/2ML IJ SOLN
INTRAMUSCULAR | Status: AC
  Filled 2019-06-10: qty 2

## 2019-06-10 MED ORDER — ONDANSETRON HCL 4 MG/2ML IJ SOLN
INTRAMUSCULAR | Status: AC
  Filled 2019-06-10: qty 2

## 2019-06-10 MED ORDER — FENTANYL CITRATE (PF) 100 MCG/2ML IJ SOLN
INTRAMUSCULAR | Status: AC
  Filled 2019-06-10: qty 2

## 2019-06-10 MED ORDER — LIDOCAINE HCL (CARDIAC) PF 100 MG/5ML IV SOSY
PREFILLED_SYRINGE | INTRAVENOUS | Status: DC | PRN
  Administered 2019-06-10: 80 mg via INTRAVENOUS

## 2019-06-10 MED ORDER — KETOROLAC TROMETHAMINE 15 MG/ML IJ SOLN
15.0000 mg | Freq: Once | INTRAMUSCULAR | Status: DC | PRN
  Filled 2019-06-10: qty 1

## 2019-06-10 MED ORDER — ONDANSETRON HCL 4 MG/2ML IJ SOLN
INTRAMUSCULAR | Status: DC | PRN
  Administered 2019-06-10: 4 mg via INTRAVENOUS

## 2019-06-10 MED ORDER — BELLADONNA ALKALOIDS-OPIUM 16.2-60 MG RE SUPP
RECTAL | Status: AC
  Filled 2019-06-10: qty 1

## 2019-06-10 MED ORDER — CEFAZOLIN SODIUM-DEXTROSE 2-4 GM/100ML-% IV SOLN
2.0000 g | INTRAVENOUS | Status: AC
  Administered 2019-06-10: 2 g via INTRAVENOUS
  Filled 2019-06-10: qty 100

## 2019-06-10 MED ORDER — BELLADONNA ALKALOIDS-OPIUM 16.2-60 MG RE SUPP
RECTAL | Status: DC | PRN
  Administered 2019-06-10: 1 via RECTAL

## 2019-06-10 MED ORDER — GLYCOPYRROLATE 0.2 MG/ML IJ SOLN
INTRAMUSCULAR | Status: DC | PRN
  Administered 2019-06-10: 0.1 mg via INTRAVENOUS

## 2019-06-10 MED ORDER — PROPOFOL 10 MG/ML IV BOLUS
INTRAVENOUS | Status: DC | PRN
  Administered 2019-06-10: 200 mg via INTRAVENOUS

## 2019-06-10 MED ORDER — DEXAMETHASONE SODIUM PHOSPHATE 10 MG/ML IJ SOLN
INTRAMUSCULAR | Status: AC
  Filled 2019-06-10: qty 1

## 2019-06-10 MED ORDER — DEXAMETHASONE SODIUM PHOSPHATE 4 MG/ML IJ SOLN
INTRAMUSCULAR | Status: DC | PRN
  Administered 2019-06-10: 5 mg via INTRAVENOUS

## 2019-06-10 MED ORDER — ONDANSETRON HCL 4 MG/2ML IJ SOLN
4.0000 mg | Freq: Once | INTRAMUSCULAR | Status: DC | PRN
  Filled 2019-06-10: qty 2

## 2019-06-10 MED ORDER — CEFAZOLIN SODIUM-DEXTROSE 2-4 GM/100ML-% IV SOLN
INTRAVENOUS | Status: AC
  Filled 2019-06-10: qty 100

## 2019-06-10 MED ORDER — CEPHALEXIN 500 MG PO CAPS: 500.0000 mg | ORAL_CAPSULE | Freq: Two times a day (BID) | ORAL | 0 refills | Status: DC

## 2019-06-10 SURGICAL SUPPLY — 27 items
BAG DRAIN URO-CYSTO SKYTR STRL (DRAIN) ×2 IMPLANT
BAG DRN UROCATH (DRAIN) ×1
BAG URINE DRAINAGE (UROLOGICAL SUPPLIES) ×2 IMPLANT
BAG URINE LEG 500ML (DRAIN) ×3 IMPLANT
BLADE SURG 15 STRL LF DISP TIS (BLADE) IMPLANT
BLADE SURG 15 STRL SS (BLADE) ×2
CATH FOLEY 2WAY SLVR  5CC 18FR (CATHETERS) ×1
CATH FOLEY 2WAY SLVR 5CC 18FR (CATHETERS) IMPLANT
CLOTH BEACON ORANGE TIMEOUT ST (SAFETY) ×4 IMPLANT
EVACUATOR MICROVAS BLADDER (UROLOGICAL SUPPLIES) ×1 IMPLANT
FIBER LASER FLEXIVA 1000 (UROLOGICAL SUPPLIES) ×1 IMPLANT
FIBER LASER FLEXIVA 200 (UROLOGICAL SUPPLIES) IMPLANT
FIBER LASER FLEXIVA 365 (UROLOGICAL SUPPLIES) IMPLANT
FIBER LASER FLEXIVA 550 (UROLOGICAL SUPPLIES) IMPLANT
GLOVE BIO SURGEON STRL SZ8 (GLOVE) ×2 IMPLANT
GOWN STRL REUS W/TWL XL LVL3 (GOWN DISPOSABLE) ×2 IMPLANT
IV NS IRRIG 3000ML ARTHROMATIC (IV SOLUTION) ×4 IMPLANT
KIT TURNOVER CYSTO (KITS) ×2 IMPLANT
MANIFOLD NEPTUNE II (INSTRUMENTS) ×2 IMPLANT
NS IRRIG 1000ML POUR BTL (IV SOLUTION) ×1 IMPLANT
NS IRRIG 500ML POUR BTL (IV SOLUTION) ×1 IMPLANT
PACK CYSTO (CUSTOM PROCEDURE TRAY) ×2 IMPLANT
SYSTEM UROLIFT (Male Continence) ×3 IMPLANT
TUBE CONNECTING 12X1/4 (SUCTIONS) ×2 IMPLANT
TUBING UROLOGY SET (TUBING) ×1 IMPLANT
WATER STERILE IRR 3000ML UROMA (IV SOLUTION) ×4 IMPLANT
WATER STERILE IRR 500ML POUR (IV SOLUTION) IMPLANT

## 2019-06-10 NOTE — Discharge Instructions (Signed)
1. You may see some blood in the urine and may have some burning with urination for 48-72 hours. You also may notice that you have to urinate more frequently or urgently after your procedure which is normal.  °2. You should call should you develop an inability urinate, fever > 101, persistent nausea and vomiting that prevents you from eating or drinking to stay hydrated.  °3. If you have a catheter, you will be taught how to take care of the catheter by the nursing staff prior to discharge from the hospital.  You may periodically feel a strong urge to void with the catheter in place.  This is a bladder spasm and most often can occur when having a bowel movement or moving around. It is typically self-limited and usually will stop after a few minutes.  You may use some Vaseline or Neosporin around the tip of the catheter to reduce friction at the tip of the penis. You may also see some blood in the urine.  A very small amount of blood can make the urine look quite red.  As long as the catheter is draining well, there usually is not a problem.  However, if the catheter is not draining well and is bloody, you should call the office ([phone]) to notify us. ° ° °Post Anesthesia Home Care Instructions ° °Activity: °Get plenty of rest for the remainder of the day. A responsible individual must stay with you for 24 hours following the procedure.  °For the next 24 hours, DO NOT: °-Drive a car °-Operate machinery °-Drink alcoholic beverages °-Take any medication unless instructed by your physician °-Make any legal decisions or sign important papers. ° °Meals: °Start with liquid foods such as gelatin or soup. Progress to regular foods as tolerated. Avoid greasy, spicy, heavy foods. If nausea and/or vomiting occur, drink only clear liquids until the nausea and/or vomiting subsides. Call your physician if vomiting continues. ° °Special Instructions/Symptoms: °Your throat may feel dry or sore from the anesthesia or the  breathing tube placed in your throat during surgery. If this causes discomfort, gargle with warm salt water. The discomfort should disappear within 24 hours. ° ° °   ° °

## 2019-06-10 NOTE — Anesthesia Postprocedure Evaluation (Signed)
Anesthesia Post Note  Patient: Jeffrey Rosario  Procedure(s) Performed: CYSTOSCOPY WITH LITHOLAPAXY (N/A ) CYSTOSCOPY WITH INSERTION OF UROLIFT (N/A ) HOLMIUM LASER APPLICATION (N/A )     Patient location during evaluation: PACU Anesthesia Type: General Level of consciousness: awake and alert Pain management: pain level controlled Vital Signs Assessment: post-procedure vital signs reviewed and stable Respiratory status: spontaneous breathing, nonlabored ventilation, respiratory function stable and patient connected to nasal cannula oxygen Cardiovascular status: blood pressure returned to baseline and stable Postop Assessment: no apparent nausea or vomiting Anesthetic complications: no    Last Vitals:  Vitals:   06/10/19 1000 06/10/19 1015  BP: 130/73 120/75  Pulse: (!) 47 70  Resp: 10 16  Temp:  36.6 C  SpO2: 98% 97%    Last Pain:  Vitals:   06/10/19 1015  TempSrc: Oral  PainSc: 0-No pain                 Ryan P Ellender

## 2019-06-10 NOTE — Anesthesia Procedure Notes (Signed)
Procedure Name: LMA Insertion Date/Time: 06/10/2019 7:48 AM Performed by: Bufford Spikes, CRNA Pre-anesthesia Checklist: Patient identified, Emergency Drugs available, Suction available and Patient being monitored Patient Re-evaluated:Patient Re-evaluated prior to induction Oxygen Delivery Method: Circle system utilized Preoxygenation: Pre-oxygenation with 100% oxygen Induction Type: IV induction Ventilation: Mask ventilation without difficulty LMA: LMA inserted LMA Size: 5.0 Number of attempts: 1 Airway Equipment and Method: Bite block Placement Confirmation: positive ETCO2 Tube secured with: Tape Dental Injury: Teeth and Oropharynx as per pre-operative assessment

## 2019-06-10 NOTE — H&P (Signed)
H&P  Chief Complaint:BPH with bladder stones   History of Present Illness: 68 yo male s/p Urolift implants 07/2018. Recent eval for persistent LUTS evealed free-floating bladder calculi and continued obstruction. He presents for repeat Urolift and cystolithalopaxy.  Past Medical History:  Diagnosis Date  . Anxiety   . Bladder stones   . Psoriasis     Past Surgical History:  Procedure Laterality Date  . CATARACT EXTRACTION, BILATERAL    . CERVICAL FUSION  2001    repeat 2005  . COLONOSCOPY  2014  . CYSTOSCOPY WITH LITHOLAPAXY N/A 06/08/2016   Procedure: CYSTOSCOPY WITH LITHOLAPAXY;  Surgeon: Ardis Hughs, MD;  Location: [facility];  Service: Urology;  Laterality: N/A;  . HOLMIUM LASER APPLICATION N/A [id number]   Procedure: HOLMIUM LASER APPLICATION;  Surgeon: Ardis Hughs, MD;  Location: [facility];  Service: Urology;  Laterality: N/A;  . knee arthrscopy     right 1980, left 1982  . LUMBAR LAMINECTOMY  1998  . ROTATOR CUFF REPAIR  2009   right    Home Medications:   Allergies: No Known Allergies  Family History  Problem Relation Age of Onset  . Colon cancer Father        does not know age of onset=mid 6's  . Colon cancer Paternal Grandfather   . Stomach cancer Neg Hx     Social History:  reports that he has never smoked. He has never used smokeless tobacco. He reports current alcohol use of about 10.0 standard drinks of alcohol per week. He reports that he does not use drugs.  ROS: A complete review of systems was performed.  All systems are negative except for pertinent findings as noted.  Physical Exam:  Vital signs in last 24 hours:   Constitutional:  Alert and oriented, No acute distress Cardiovascular: Regular rate  Respiratory: Normal respiratory effort GI: Abdomen is soft, nontender, nondistended, no abdominal masses. No CVAT.  Genitourinary: Normal male phallus, testes are descended bilaterally and non-tender and without masses, scrotum is normal in  appearance without lesions or masses, perineum is normal on inspection. Lymphatic: No lymphadenopathy Neurologic: Grossly intact, no focal deficits Psychiatric: Normal mood and affect  Laboratory Data:  No results for input(s): WBC, HGB, HCT, PLT in the last 72 hours.  No results for input(s): NA, K, CL, GLUCOSE, BUN, CALCIUM, CREATININE in the last 72 hours.  Invalid input(s): CO3   No results found for this or any previous visit (from the past 24 hour(s)). Recent Results (from the past 240 hour(s))  Novel Coronavirus, NAA (Hosp order, Send-out to Ref Lab; TAT 18-24 hrs     Status: None   Collection Time: 06/06/19  2:54 PM   Specimen: Nasopharyngeal Swab; Respiratory  Result Value Ref Range Status   SARS-CoV-2, NAA NOT DETECTED NOT DETECTED Final    Comment: (NOTE) This nucleic acid amplification test was developed and its performance characteristics determined by [employer]. Nucleic acid amplification tests include PCR and TMA. This test has not been FDA cleared or approved. This test has been authorized by FDA under an Emergency Use Authorization (EUA). This test is only authorized for the duration of time the declaration that circumstances exist justifying the authorization of the emergency use of in vitro diagnostic tests for detection of SARS-CoV-2 virus and/or diagnosis of COVID-19 infection under section 564(b)(1) of the Act, 21 U.S.C. [id number]) (1), unless the authorization is terminated or revoked sooner. When diagnostic testing is negative, the possibility of a false negative result should  be considered in the context of a patient's recent exposures and the presence of clinical signs and symptoms consistent with COVID-19. An individual without symptoms of COVID- 19 and who is not shedding SARS-CoV-2 vi rus would expect to have a negative (not detected) result in this assay. Performed At: [facility] [address] [place], [place]  [id number] Rush Farmer MD [id number]    Smiley  Final    Comment: Performed at [facility], [place] [address], [place], [place] [address]    Renal Function: No results for input(s): CREATININE in the last 168 hours. CrCl cannot be calculated (No successful lab value found.).  Radiologic Imaging: No results found.  Impression/Assessment:  Bladder calculi, BPH w/ ubstruction  Plan:  Repeat Urolift, cystolithalopaxy

## 2019-06-10 NOTE — Interval H&P Note (Signed)
History and Physical Interval Note:  06/10/2019 7:30 AM  Jeffrey Rosario  has presented today for surgery, with the diagnosis of BENIGN PROSTATIC HYPERPLASIA, BLADDER STONE.  The various methods of treatment have been discussed with the patient and family. After consideration of risks, benefits and other options for treatment, the patient has consented to  Procedure(s): CYSTOSCOPY WITH LITHOLAPAXY (N/A) CYSTOSCOPY WITH INSERTION OF UROLIFT (N/A) HOLMIUM LASER APPLICATION (N/A) as a surgical intervention.  The patient's history has been reviewed, patient examined, no change in status, stable for surgery.  I have reviewed the patient's chart and labs.  Questions were answered to the patient's satisfaction.     Lillette Boxer Keita Demarco

## 2019-06-10 NOTE — Transfer of Care (Signed)
Immediate Anesthesia Transfer of Care Note  Patient: Jeffrey Rosario  Procedure(s) Performed: CYSTOSCOPY WITH LITHOLAPAXY (N/A ) CYSTOSCOPY WITH INSERTION OF UROLIFT (N/A ) HOLMIUM LASER APPLICATION (N/A )  Patient Location: PACU  Anesthesia Type:General  Level of Consciousness: awake, alert  and oriented  Airway & Oxygen Therapy: Patient Spontanous Breathing and Patient connected to nasal cannula oxygen  Post-op Assessment: Report given to RN and Post -op Vital signs reviewed and stable  Post vital signs: Reviewed and stable  Last Vitals:  Vitals Value Taken Time  BP    Temp    Pulse    Resp    SpO2      Last Pain:  Vitals:   06/10/19 0637  TempSrc: Oral  PainSc: 0-No pain      Patients Stated Pain Goal: 8 ([id number])  Complications: No apparent anesthesia complications

## 2019-06-11 ENCOUNTER — Encounter (HOSPITAL_BASED_OUTPATIENT_CLINIC_OR_DEPARTMENT_OTHER): Payer: Self-pay | Admitting: Urology

## 2019-06-16 NOTE — Op Note (Signed)
Preoperative diagnosis: BPH, bladder calculi (largest less than 20 mm in size)  Postoperative diagnosis: Same  Principal procedure: Cystoscopy, cystolitholapaxy with holmium laser of bladder calculi, UroLift placement (3 implants)  Surgeon: Patte Winkel  Anesthesia: General with LMA  Complications: None  Specimen: Stone fragments  Drains: 65 French Foley catheter to leg bag  Estimated blood loss: Less than 25 mL  Findings: Minimally obstructive prostate with partially obstructive lateral lobes.  I did not see prior implants which had been placed in the past.  He had 3-4 bladder calculi, the largest of which was under 20 mm in size).  There were no bladder lesions.  Description of procedure: The patient was properly identified in the holding area.  His team to the operating room where general anesthetic was administered with the LMA.  He was placed in the dorsolithotomy position.  Genitalia and perineum were prepped and draped, proper timeout was performed.  52 French panendoscope was placed under direct vision.  Cystoscopy revealed slightly obstructive lateral lobes towards the base of the prostate.  There were no urethral lesions.  His bladder was entered and inspected.  The previously mentioned stones were seen.  The thousand micron laser fiber was then used with the holmium laser power, the stones were fragmented into multiple smaller fragments which were then carefully extracted with direct drainage by placing the scope near the stones or with irrigation using the Microvasive aspirator.  Once all stone fragments were extracted, I then proceeded with the UroLift procedure.  2 implants were placed on the right lateral lobe, at the base.  1 of these implants, despite being properly placed, pulled through.  A third implant was placed at the left prostatic base.  There was excellent anterior channel seen following this.  At this point, 33 [place] Foley catheter was placed, the balloon filled with 10  cc of water and placed to dependent drainage.  Procedure was terminated.  The patient tolerated procedure well and was returned to the PACU in stable condition.

## 2019-09-26 ENCOUNTER — Ambulatory Visit
Admission: RE | Admit: 2019-09-26 | Discharge: 2019-09-26 | Disposition: A | Discharge status: Home / Self Care | Payer: Medicare HMO | Source: Ambulatory Visit | Attending: Internal Medicine | Admitting: Internal Medicine

## 2019-09-26 ENCOUNTER — Other Ambulatory Visit: Payer: Self-pay | Admitting: Internal Medicine

## 2019-09-26 DIAGNOSIS — R634 Abnormal weight loss: Secondary | ICD-10-CM

## 2019-10-01 ENCOUNTER — Ambulatory Visit: Payer: Medicare Other | Attending: Internal Medicine

## 2019-10-01 DIAGNOSIS — Z23 Encounter for immunization: Secondary | ICD-10-CM

## 2019-10-01 NOTE — Progress Notes (Signed)
   Covid-19 Vaccination Clinic  Name:  Jeffrey Rosario    MRN: [id number] DOB: May 28, 1951  10/01/2019  [name] was observed post Covid-19 immunization for 15 minutes without incidence. He was provided with Vaccine Information Sheet and instruction to access the V-Safe system.   [name] was instructed to call 911 with any severe reactions post vaccine: Marland Kitchen Difficulty breathing  . Swelling of your face and throat  . A fast heartbeat  . A bad rash all over your body  . Dizziness and weakness    Immunizations Administered    Name Date Dose VIS Date Route   Pfizer COVID-19 Vaccine 10/01/2019  3:43 PM 0.3 mL 08/23/2019 Intramuscular   Manufacturer: [place]   Lot: [id number]   [place]: [id number]

## 2019-10-21 ENCOUNTER — Ambulatory Visit: Payer: Medicare HMO | Attending: Internal Medicine

## 2019-10-21 DIAGNOSIS — Z23 Encounter for immunization: Secondary | ICD-10-CM | POA: Insufficient documentation

## 2019-10-21 NOTE — Progress Notes (Signed)
   Covid-19 Vaccination Clinic  Name:  Jeffrey Rosario    MRN: [id number] DOB: 08/25/1951  10/21/2019  [name] was observed post Covid-19 immunization for 15 minutes without incidence. He was provided with Vaccine Information Sheet and instruction to access the V-Safe system.   [name] was instructed to call 911 with any severe reactions post vaccine: Marland Kitchen Difficulty breathing  . Swelling of your face and throat  . A fast heartbeat  . A bad rash all over your body  . Dizziness and weakness    Immunizations Administered    Name Date Dose VIS Date Route   Pfizer COVID-19 Vaccine 10/21/2019 10:46 AM 0.3 mL 08/23/2019 Intramuscular   Manufacturer: [place]   Lot: [id number]   [place]: [id number]

## 2020-09-17 IMAGING — CR DG CHEST 2V
2 series · 2 of 2 positions shown · non-contrast
Comparison: 01/28/2005

CLINICAL DATA: Unintentional weight loss

EXAM:
CHEST - 2 VIEW

[w chest pa]
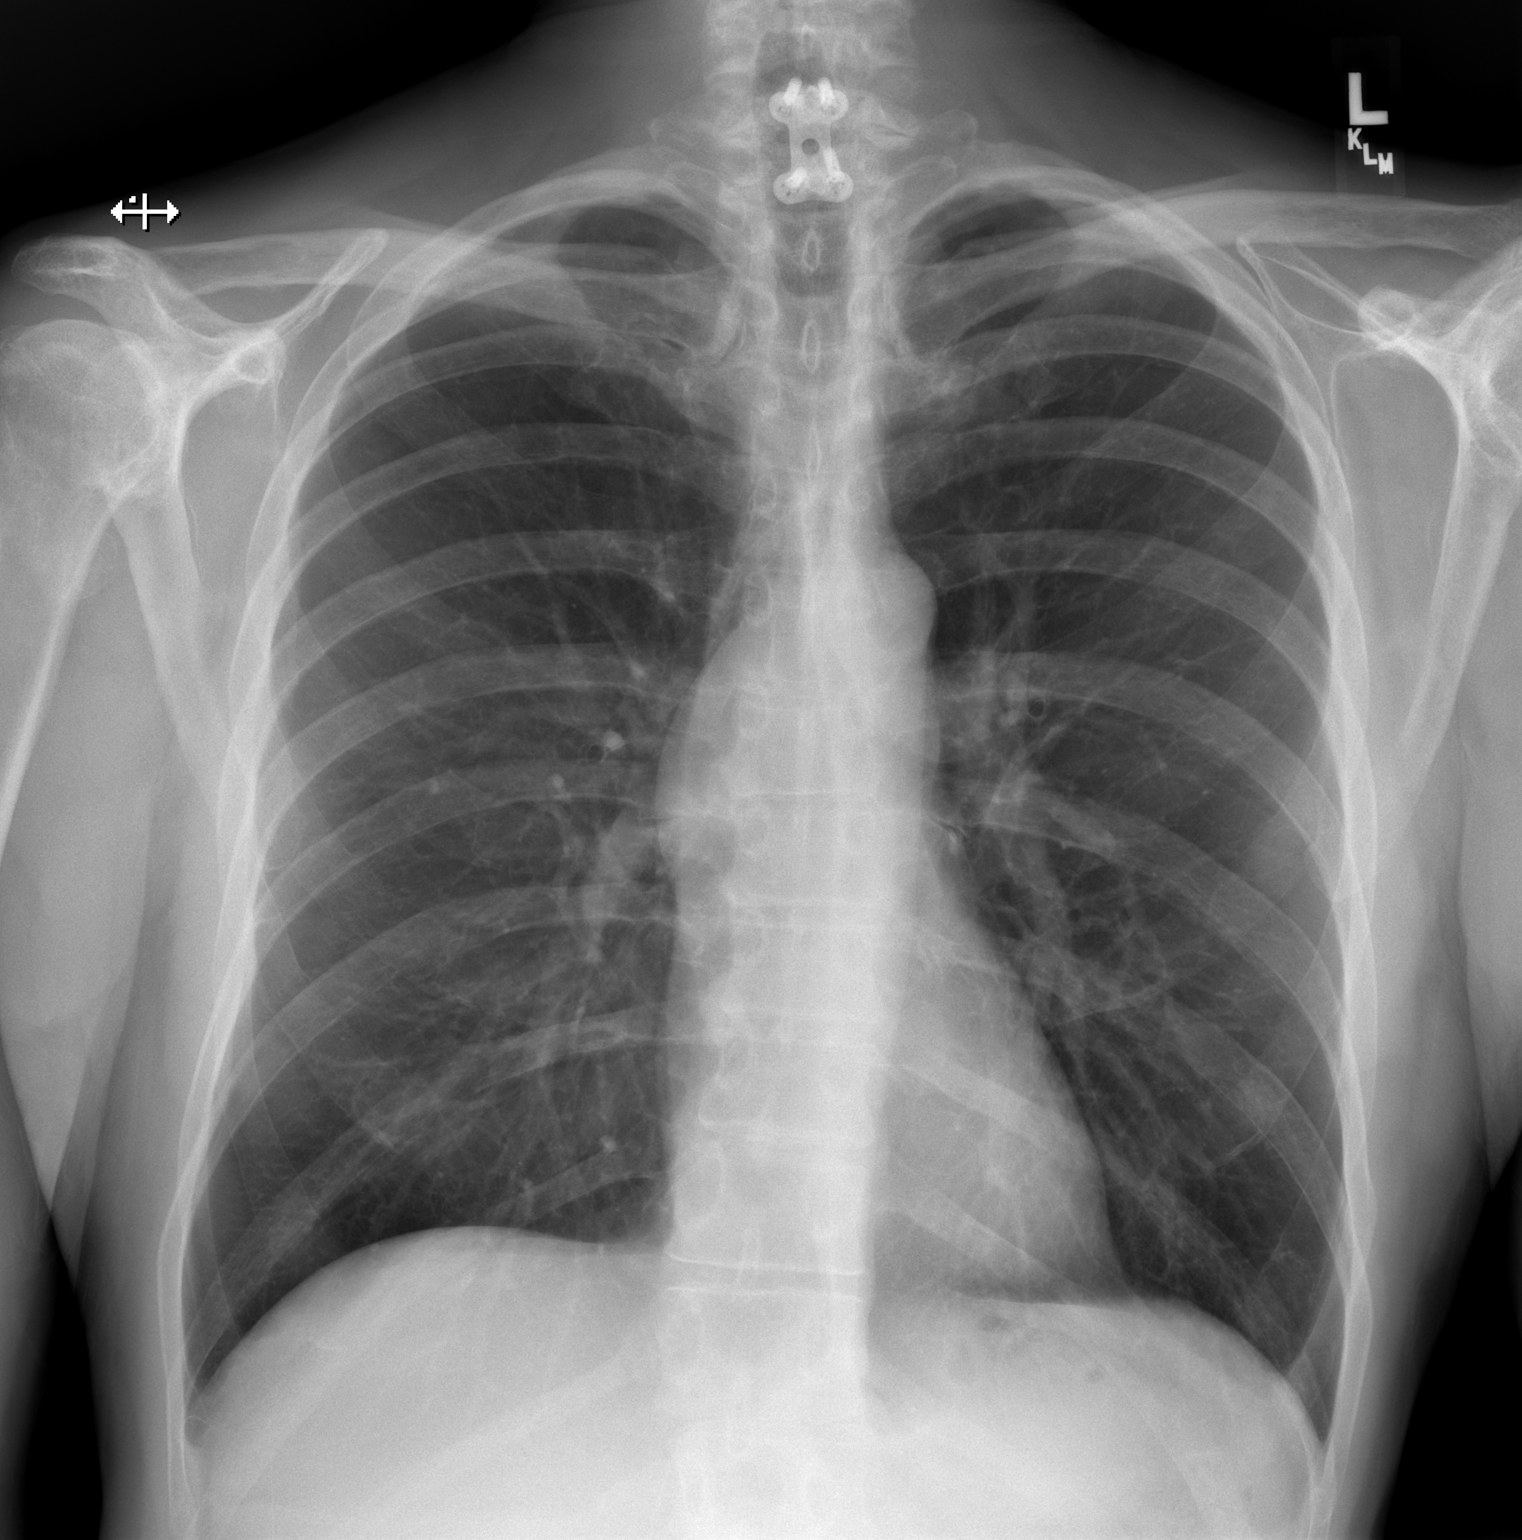

[w chest lat]
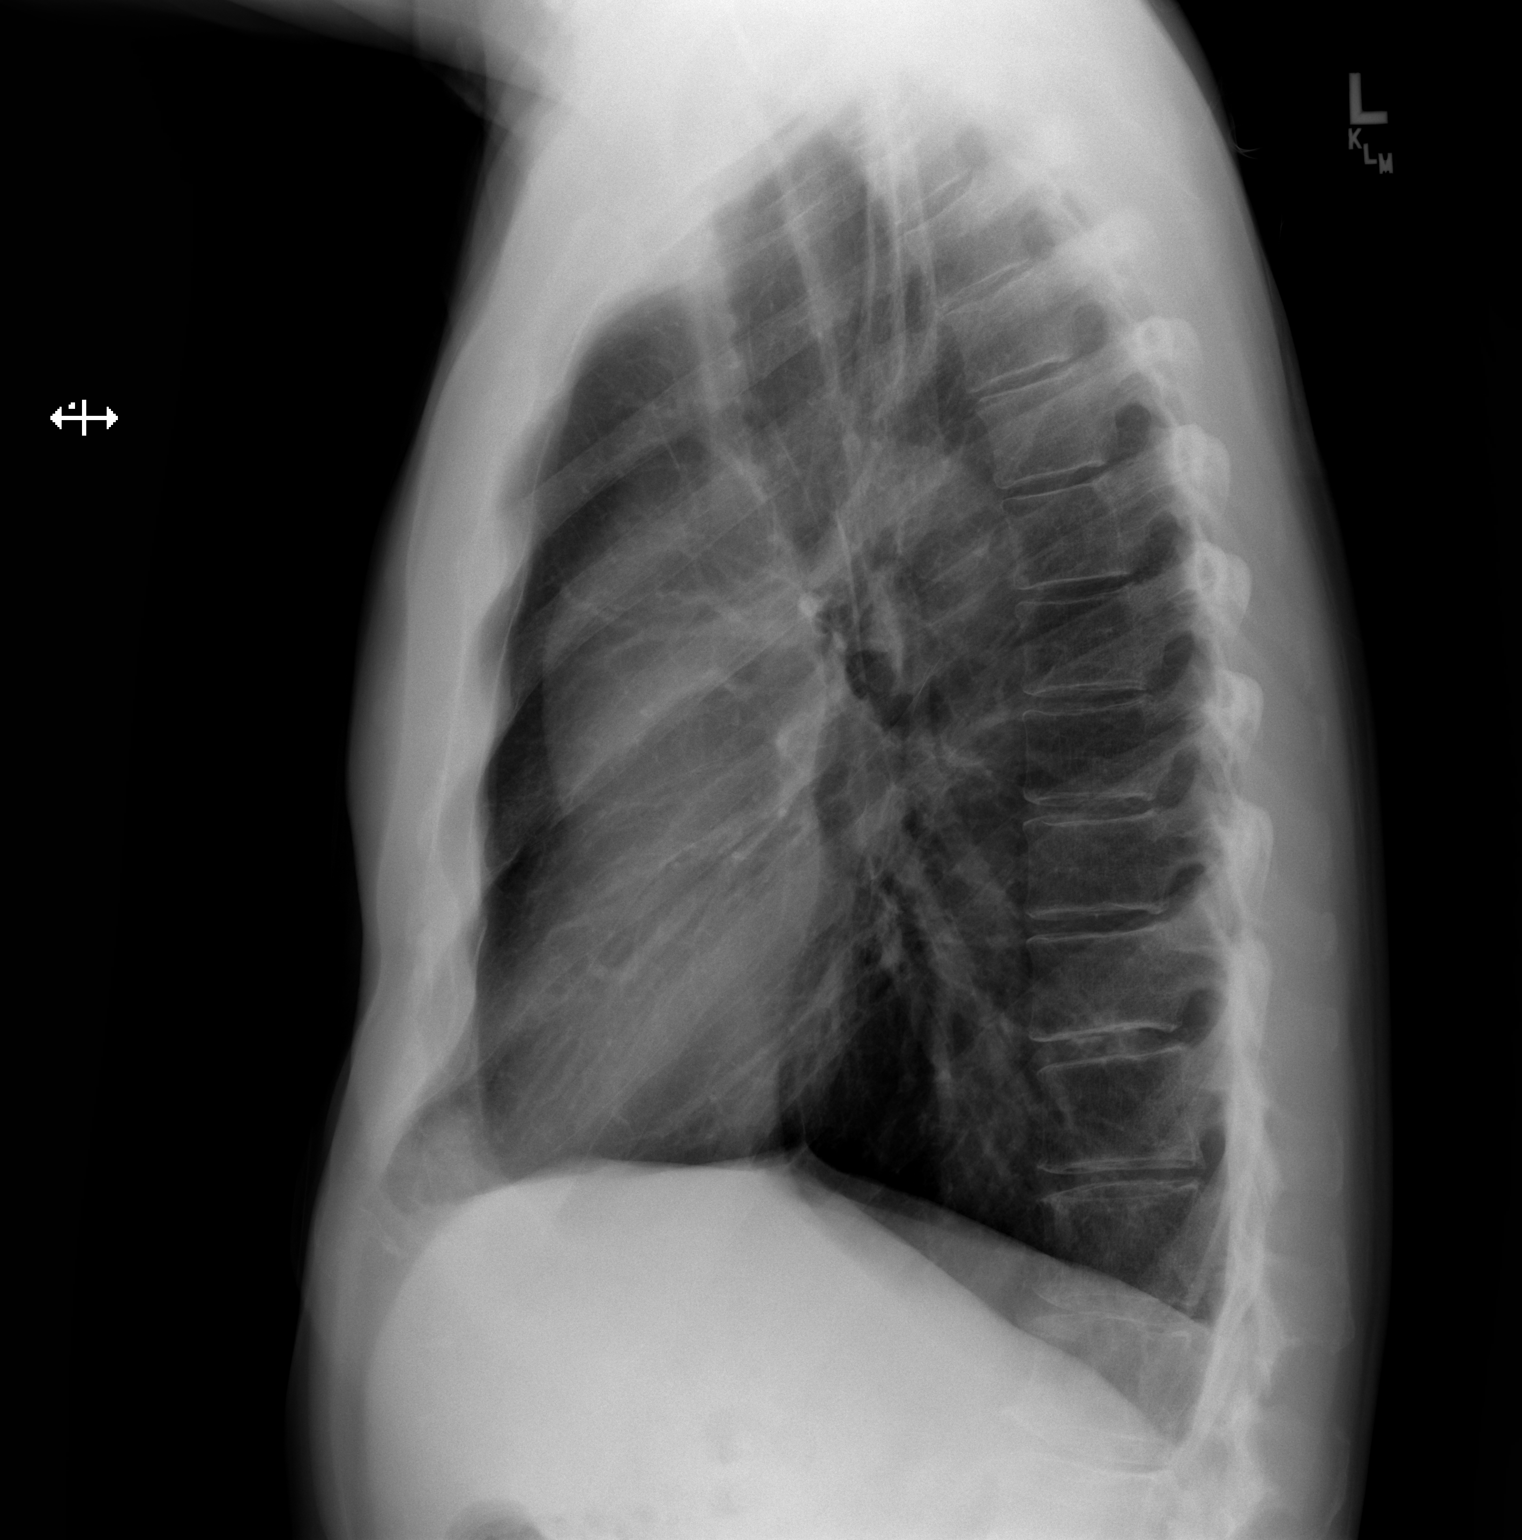

[2 of 2 positions shown; findings below may reference images not displayed]

FINDINGS: The heart size and mediastinal contours are within normal limits.
Both lungs are clear. The visualized skeletal structures are
unremarkable. Cervical fusion plate.
IMPRESSION: No active cardiopulmonary disease.

## 2020-10-13 DIAGNOSIS — M25561 Pain in right knee: Secondary | ICD-10-CM | POA: Diagnosis not present

## 2020-10-13 DIAGNOSIS — Z0100 Encounter for examination of eyes and vision without abnormal findings: Secondary | ICD-10-CM | POA: Diagnosis not present

## 2020-10-13 DIAGNOSIS — M7541 Impingement syndrome of right shoulder: Secondary | ICD-10-CM | POA: Diagnosis not present

## 2020-10-30 DIAGNOSIS — Z0100 Encounter for examination of eyes and vision without abnormal findings: Secondary | ICD-10-CM | POA: Diagnosis not present

## 2020-11-23 DIAGNOSIS — Z0100 Encounter for examination of eyes and vision without abnormal findings: Secondary | ICD-10-CM | POA: Diagnosis not present

## 2020-12-23 DIAGNOSIS — E782 Mixed hyperlipidemia: Secondary | ICD-10-CM | POA: Diagnosis not present

## 2020-12-24 DIAGNOSIS — N401 Enlarged prostate with lower urinary tract symptoms: Secondary | ICD-10-CM | POA: Diagnosis not present

## 2020-12-24 DIAGNOSIS — R350 Frequency of micturition: Secondary | ICD-10-CM | POA: Diagnosis not present

## 2020-12-24 DIAGNOSIS — R310 Gross hematuria: Secondary | ICD-10-CM | POA: Diagnosis not present

## 2020-12-24 DIAGNOSIS — N210 Calculus in bladder: Secondary | ICD-10-CM | POA: Diagnosis not present

## 2020-12-30 DIAGNOSIS — Z0000 Encounter for general adult medical examination without abnormal findings: Secondary | ICD-10-CM | POA: Diagnosis not present

## 2020-12-30 DIAGNOSIS — Z1339 Encounter for screening examination for other mental health and behavioral disorders: Secondary | ICD-10-CM | POA: Diagnosis not present

## 2020-12-30 DIAGNOSIS — E782 Mixed hyperlipidemia: Secondary | ICD-10-CM | POA: Diagnosis not present

## 2020-12-30 DIAGNOSIS — N401 Enlarged prostate with lower urinary tract symptoms: Secondary | ICD-10-CM | POA: Diagnosis not present

## 2020-12-30 DIAGNOSIS — R69 Illness, unspecified: Secondary | ICD-10-CM | POA: Diagnosis not present

## 2020-12-30 DIAGNOSIS — Z86010 Personal history of colonic polyps: Secondary | ICD-10-CM | POA: Diagnosis not present

## 2020-12-30 DIAGNOSIS — N182 Chronic kidney disease, stage 2 (mild): Secondary | ICD-10-CM | POA: Diagnosis not present

## 2020-12-30 DIAGNOSIS — Z1331 Encounter for screening for depression: Secondary | ICD-10-CM | POA: Diagnosis not present

## 2021-01-01 DIAGNOSIS — N210 Calculus in bladder: Secondary | ICD-10-CM | POA: Diagnosis not present

## 2021-01-15 DIAGNOSIS — Y929 Unspecified place or not applicable: Secondary | ICD-10-CM | POA: Diagnosis not present

## 2021-01-15 DIAGNOSIS — L500 Allergic urticaria: Secondary | ICD-10-CM | POA: Diagnosis not present

## 2021-01-15 DIAGNOSIS — R21 Rash and other nonspecific skin eruption: Secondary | ICD-10-CM | POA: Diagnosis not present

## 2021-01-15 DIAGNOSIS — T7840XA Allergy, unspecified, initial encounter: Secondary | ICD-10-CM | POA: Diagnosis not present

## 2021-01-15 DIAGNOSIS — Y939 Activity, unspecified: Secondary | ICD-10-CM | POA: Diagnosis not present

## 2021-01-22 ENCOUNTER — Other Ambulatory Visit: Payer: Self-pay | Admitting: Urology

## 2021-03-08 DIAGNOSIS — R3912 Poor urinary stream: Secondary | ICD-10-CM | POA: Diagnosis not present

## 2021-03-08 DIAGNOSIS — N401 Enlarged prostate with lower urinary tract symptoms: Secondary | ICD-10-CM | POA: Diagnosis not present

## 2021-03-08 DIAGNOSIS — N210 Calculus in bladder: Secondary | ICD-10-CM | POA: Diagnosis not present

## 2021-03-18 ENCOUNTER — Encounter (HOSPITAL_BASED_OUTPATIENT_CLINIC_OR_DEPARTMENT_OTHER): Payer: Self-pay | Admitting: Urology

## 2021-03-18 ENCOUNTER — Other Ambulatory Visit: Payer: Self-pay

## 2021-03-18 DIAGNOSIS — N210 Calculus in bladder: Secondary | ICD-10-CM

## 2021-03-18 DIAGNOSIS — M419 Scoliosis, unspecified: Secondary | ICD-10-CM

## 2021-03-18 HISTORY — DX: Calculus in bladder: N21.0

## 2021-03-18 HISTORY — DX: Scoliosis, unspecified: M41.9

## 2021-03-18 NOTE — Progress Notes (Signed)
Spoke w/ via phone for pre-op interview---pt Lab needs dos----    none           Lab results------none COVID test -----patient states asymptomatic no test needed Arrive at -------725 am 03-22-2021 NPO after MN NO Solid Food.  Clear liquids from MN until---625 am then npo Med rec completed Medications to take morning of surgery -----bupropion Diabetic medication -----n/a Patient instructed no nail polish to be worn day of surgery Patient instructed to bring photo id and insurance card day of surgery Patient aware to have Driver (ride ) / caregiver   won jonathan to drop pt off and wife amy coming later and is driver home  for 24 hours after surgery  Patient Special Instructions -----none Pre-Op special Istructions -----none Patient verbalized understanding of instructions that were given at this phone interview. Patient denies shortness of breath, chest pain, fever, cough at this phone interview.

## 2021-03-21 NOTE — Anesthesia Preprocedure Evaluation (Addendum)
Anesthesia Evaluation  Patient identified by MRN, date of birth, ID band  Reviewed: Allergy & Precautions, NPO status , Patient's Chart, lab work & pertinent test results  Airway Mallampati: II  TM Distance: >3 FB Neck ROM: Full    Dental no notable dental hx. (+) Teeth Intact, Dental Advisory Given   Pulmonary neg pulmonary ROS,    Pulmonary exam normal breath sounds clear to auscultation       Cardiovascular Exercise Tolerance: Good negative cardio ROS Normal cardiovascular exam Rhythm:Regular Rate:Normal     Neuro/Psych negative neurological ROS     GI/Hepatic negative GI ROS, Neg liver ROS,   Endo/Other  negative endocrine ROS  Renal/GU Renal diseaseBladder stone     Musculoskeletal negative musculoskeletal ROS (+)   Abdominal   Peds  Hematology negative hematology ROS (+)   Anesthesia Other Findings   Reproductive/Obstetrics                            Anesthesia Physical Anesthesia Plan  ASA: 2  Anesthesia Plan: General   Post-op Pain Management:    Induction: Intravenous  PONV Risk Score and Plan: 3 and Treatment may vary due to age or medical condition, Midazolam, Ondansetron and Dexamethasone  Airway Management Planned: LMA  Additional Equipment: None  Intra-op Plan:   Post-operative Plan:   Informed Consent: I have reviewed the patients History and Physical, chart, labs and discussed the procedure including the risks, benefits and alternatives for the proposed anesthesia with the patient or authorized representative who has indicated his/her understanding and acceptance.     Dental advisory given  Plan Discussed with: CRNA  Anesthesia Plan Comments:       Anesthesia Quick Evaluation

## 2021-03-22 ENCOUNTER — Encounter (HOSPITAL_BASED_OUTPATIENT_CLINIC_OR_DEPARTMENT_OTHER)
Admission: RE | Disposition: A | Discharge status: Home / Self Care | Payer: Self-pay | Source: Home / Self Care | Attending: Urology

## 2021-03-22 ENCOUNTER — Ambulatory Visit (HOSPITAL_BASED_OUTPATIENT_CLINIC_OR_DEPARTMENT_OTHER)
Admission: RE | Admit: 2021-03-22 | Discharge: 2021-03-22 | Disposition: A | Discharge status: Home / Self Care | Payer: Medicare HMO | Attending: Urology | Admitting: Urology

## 2021-03-22 ENCOUNTER — Ambulatory Visit (HOSPITAL_BASED_OUTPATIENT_CLINIC_OR_DEPARTMENT_OTHER): Payer: Medicare HMO | Admitting: Anesthesiology

## 2021-03-22 ENCOUNTER — Encounter (HOSPITAL_BASED_OUTPATIENT_CLINIC_OR_DEPARTMENT_OTHER): Payer: Self-pay | Admitting: Urology

## 2021-03-22 DIAGNOSIS — R69 Illness, unspecified: Secondary | ICD-10-CM | POA: Diagnosis not present

## 2021-03-22 DIAGNOSIS — N3289 Other specified disorders of bladder: Secondary | ICD-10-CM | POA: Insufficient documentation

## 2021-03-22 DIAGNOSIS — N210 Calculus in bladder: Secondary | ICD-10-CM | POA: Insufficient documentation

## 2021-03-22 HISTORY — PX: CYSTOSCOPY WITH LITHOLAPAXY: SHX1425

## 2021-03-22 HISTORY — PX: HOLMIUM LASER APPLICATION: SHX5852

## 2021-03-22 SURGERY — CYSTOSCOPY, WITH BLADDER CALCULUS LITHOLAPAXY
Anesthesia: General | Site: Bladder

## 2021-03-22 MED ORDER — OXYCODONE HCL 5 MG PO TABS: 5.0000 mg | ORAL_TABLET | Freq: Once | ORAL | Status: DC | PRN

## 2021-03-22 MED ORDER — CEPHALEXIN 500 MG PO CAPS: 500.0000 mg | ORAL_CAPSULE | Freq: Two times a day (BID) | ORAL | 0 refills | Status: AC

## 2021-03-22 MED ORDER — FENTANYL CITRATE (PF) 100 MCG/2ML IJ SOLN
INTRAMUSCULAR | Status: DC | PRN
  Administered 2021-03-22 (×2): 50 ug via INTRAVENOUS

## 2021-03-22 MED ORDER — ONDANSETRON HCL 4 MG/2ML IJ SOLN: 4.0000 mg | Freq: Once | INTRAMUSCULAR | Status: DC | PRN

## 2021-03-22 MED ORDER — MIDAZOLAM HCL 2 MG/2ML IJ SOLN
INTRAMUSCULAR | Status: AC
  Filled 2021-03-22: qty 2

## 2021-03-22 MED ORDER — AMISULPRIDE (ANTIEMETIC) 5 MG/2ML IV SOLN: 10.0000 mg | Freq: Once | INTRAVENOUS | Status: DC | PRN

## 2021-03-22 MED ORDER — DEXAMETHASONE SODIUM PHOSPHATE 10 MG/ML IJ SOLN
INTRAMUSCULAR | Status: AC
  Filled 2021-03-22: qty 1

## 2021-03-22 MED ORDER — CEFAZOLIN SODIUM-DEXTROSE 2-4 GM/100ML-% IV SOLN
INTRAVENOUS | Status: AC
  Filled 2021-03-22: qty 100

## 2021-03-22 MED ORDER — MIDAZOLAM HCL 5 MG/5ML IJ SOLN
INTRAMUSCULAR | Status: DC | PRN
  Administered 2021-03-22: 2 mg via INTRAVENOUS

## 2021-03-22 MED ORDER — ONDANSETRON HCL 4 MG/2ML IJ SOLN
INTRAMUSCULAR | Status: AC
  Filled 2021-03-22: qty 2

## 2021-03-22 MED ORDER — HYDROMORPHONE HCL 1 MG/ML IJ SOLN: 0.2500 mg | INTRAMUSCULAR | Status: DC | PRN

## 2021-03-22 MED ORDER — SODIUM CHLORIDE 0.9 % IR SOLN
Status: DC | PRN
  Administered 2021-03-22 (×3): 3000 mL

## 2021-03-22 MED ORDER — CEFAZOLIN SODIUM-DEXTROSE 2-4 GM/100ML-% IV SOLN
2.0000 g | INTRAVENOUS | Status: AC
  Administered 2021-03-22: 2 g via INTRAVENOUS

## 2021-03-22 MED ORDER — LACTATED RINGERS IV SOLN: INTRAVENOUS | Status: DC

## 2021-03-22 MED ORDER — PHENAZOPYRIDINE HCL 200 MG PO TABS: 200.0000 mg | ORAL_TABLET | Freq: Three times a day (TID) | ORAL | 0 refills | Status: AC | PRN

## 2021-03-22 MED ORDER — PROPOFOL 500 MG/50ML IV EMUL
INTRAVENOUS | Status: AC
  Filled 2021-03-22: qty 50

## 2021-03-22 MED ORDER — LIDOCAINE HCL (PF) 2 % IJ SOLN
INTRAMUSCULAR | Status: AC
  Filled 2021-03-22: qty 5

## 2021-03-22 MED ORDER — PROPOFOL 10 MG/ML IV BOLUS
INTRAVENOUS | Status: DC | PRN
  Administered 2021-03-22: 150 mg via INTRAVENOUS

## 2021-03-22 MED ORDER — LIDOCAINE 2% (20 MG/ML) 5 ML SYRINGE
INTRAMUSCULAR | Status: DC | PRN
  Administered 2021-03-22: 100 mg via INTRAVENOUS

## 2021-03-22 MED ORDER — FENTANYL CITRATE (PF) 100 MCG/2ML IJ SOLN
INTRAMUSCULAR | Status: AC
  Filled 2021-03-22: qty 2

## 2021-03-22 MED ORDER — ONDANSETRON HCL 4 MG/2ML IJ SOLN
INTRAMUSCULAR | Status: DC | PRN
  Administered 2021-03-22: 4 mg via INTRAVENOUS

## 2021-03-22 MED ORDER — DEXAMETHASONE SODIUM PHOSPHATE 10 MG/ML IJ SOLN
INTRAMUSCULAR | Status: DC | PRN
  Administered 2021-03-22: 10 mg via INTRAVENOUS

## 2021-03-22 MED ORDER — ACETAMINOPHEN 10 MG/ML IV SOLN: 1000.0000 mg | Freq: Once | INTRAVENOUS | Status: DC | PRN

## 2021-03-22 MED ORDER — OXYCODONE HCL 5 MG/5ML PO SOLN: 5.0000 mg | Freq: Once | ORAL | Status: DC | PRN

## 2021-03-22 SURGICAL SUPPLY — 21 items
BAG DRAIN URO-CYSTO SKYTR STRL (DRAIN) ×2 IMPLANT
BAG DRN UROCATH (DRAIN) ×1
CLOTH BEACON ORANGE TIMEOUT ST (SAFETY) ×3 IMPLANT
COVER DOME SNAP 22 D (MISCELLANEOUS) ×1 IMPLANT
FIBER LASER FLEXIVA 1000 (UROLOGICAL SUPPLIES) ×1 IMPLANT
FIBER LASER FLEXIVA 200 (UROLOGICAL SUPPLIES) IMPLANT
FIBER LASER FLEXIVA 365 (UROLOGICAL SUPPLIES) IMPLANT
FIBER LASER FLEXIVA 550 (UROLOGICAL SUPPLIES) IMPLANT
GLOVE SURG ENC MOIS LTX SZ8 (GLOVE) ×2 IMPLANT
GLOVE SURG UNDER POLY LF SZ7 (GLOVE) ×1 IMPLANT
GOWN STRL REUS W/TWL LRG LVL3 (GOWN DISPOSABLE) ×2 IMPLANT
GOWN STRL REUS W/TWL XL LVL3 (GOWN DISPOSABLE) ×2 IMPLANT
IV NS IRRIG 3000ML ARTHROMATIC (IV SOLUTION) ×3 IMPLANT
KIT TURNOVER CYSTO (KITS) ×2 IMPLANT
MANIFOLD NEPTUNE II (INSTRUMENTS) ×2 IMPLANT
PACK CYSTO (CUSTOM PROCEDURE TRAY) ×2 IMPLANT
SYR TOOMEY IRRIG 70ML (MISCELLANEOUS) ×2
SYRINGE TOOMEY IRRIG 70ML (MISCELLANEOUS) IMPLANT
TUBE CONNECTING 12X1/4 (SUCTIONS) ×2 IMPLANT
TUBING UROLOGY SET (TUBING) ×1 IMPLANT
WATER STERILE IRR 500ML POUR (IV SOLUTION) IMPLANT

## 2021-03-22 NOTE — H&P (Signed)
H&P  Chief Complaint: Bladder stones  History of Present Illness:  71 year old male presents today for cystoscopy and cystolitholapaxy for recurrent bladder calculi.  Past Medical History:  Diagnosis Date   Anxiety    Bladder stones 03/18/2021   Scoliosis 03/18/2021    Past Surgical History:  Procedure Laterality Date   CATARACT EXTRACTION, BILATERAL Bilateral 2019   CERVICAL FUSION  2001    repeat 2005   COLONOSCOPY  2014   and 2019   CYSTOSCOPY WITH INSERTION OF UROLIFT N/A 06/10/2019   Procedure: CYSTOSCOPY WITH INSERTION OF UROLIFT;  Surgeon: Franchot Gallo, MD;  Location: [facility];  Service: Urology;  Laterality: N/A;   CYSTOSCOPY WITH LITHOLAPAXY N/A 06/08/2016   Procedure: CYSTOSCOPY WITH LITHOLAPAXY;  Surgeon: Ardis Hughs, MD;  Location: [facility];  Service: Urology;  Laterality: N/A;   CYSTOSCOPY WITH LITHOLAPAXY N/A 06/10/2019   Procedure: CYSTOSCOPY WITH LITHOLAPAXY;  Surgeon: Franchot Gallo, MD;  Location[facility];  Service: Urology;  Laterality: N/A;   HOLMIUM LASER APPLICATION N/A 72/05/4708   Procedure: HOLMIUM LASER APPLICATION;  Surgeon: Ardis Hughs, MD;  Location: [facility];  Service: Urology;  Laterality: N/A;   HOLMIUM LASER APPLICATION N/A 62/83/6629   Procedure: HOLMIUM LASER APPLICATION;  Surgeon: Franchot Gallo, MD;  Location: [facility];  Service: Urology;  Laterality: N/A;   knee arthrscopy     right 1980, left [place]  2009   right    Home Medications:    Allergies: No Known Allergies  Family History  Problem Relation Age of Onset   Colon cancer Father        does not know age of onset=mid 69's   Colon cancer Paternal Grandfather    Stomach cancer Neg Hx     Social History:  reports that he has never smoked. He has never used smokeless tobacco. He reports current alcohol use of about 10.0 standard drinks of alcohol per  week. He reports that he does not use drugs.  ROS: A complete review of systems was performed.  All systems are negative except for pertinent findings as noted.  Physical Exam:  Vital signs in last 24 hours: Ht 6\' 1"  (1.854 m)   Wt 70.3 kg   BMI 20.45 kg/m  Constitutional:  Alert and oriented, No acute distress Cardiovascular: Regular rate  Respiratory: Normal respiratory effort GI: Abdomen is soft, nontender, nondistended, no abdominal masses. No CVAT.  Genitourinary: Normal male phallus, testes are descended bilaterally and non-tender and without masses, scrotum is normal in appearance without lesions or masses, perineum is normal on inspection. Lymphatic: No lymphadenopathy Neurologic: Grossly intact, no focal deficits Psychiatric: Normal mood and affect  Laboratory Data:  No results for input(s): WBC, HGB, HCT, PLT in the last 72 hours.  No results for input(s): NA, K, CL, GLUCOSE, BUN, CALCIUM, CREATININE in the last 72 hours.  Invalid input(s): CO3   No results found for this or any previous visit (from the past 24 hour(s)). No results found for this or any previous visit (from the past 240 hour(s)).  Renal Function: No results for input(s): CREATININE in the last 168 hours. CrCl cannot be calculated (No successful lab value found.).  Radiologic Imaging: No results found.  Impression/Assessment:    Bladder calculi  Plan:   cystolitholapaxy

## 2021-03-22 NOTE — Op Note (Signed)
Preoperative diagnosis: Bladder calculi, greatest size 15 mm  Postoperative diagnosis: Same  Procedure: Cystolitholapaxy using laser  Surgeon: Lynelle Weiler  Anesthesia: General with LMA  Complications: None  Drains: None  Estimated blood loss: Less than 10 mL  Specimen: Stone fragments  Indications: 70 year old male with recurrent bladder calculi.  These are relatively asymptomatic but recent cystoscopy showed several of these stones with the largest stone being approximately 15 mm in size.  I have discussed repeat cystolitholapaxy with the patient.  He understands the procedure, risks and complications as well as expected goals.  He desires to proceed.  Description of procedure: The patient was properly identified in the holding area.  He is taken to the operating room where general anesthetic was administered with the LMA.  He was placed in the dorsolithotomy position.  Genitalia and perineum were prepped, draped, proper timeout was performed.  21 French panendoscope was advanced into the bladder using the Foroblique lens.  Systematic inspection was performed.  There were mild to moderate trabeculations of the bladder.  Ureteral orifice ease were normal in location and configuration.  The patient had had a prior UroLift.  Prostatic urethra was nonobstructive.  There was a moderate sized median lobe.  I did not see any capsular tabs in the bladder neck area, with circumferential inspection performed.  Following identification of several stones, he is the 1000 m laser to apply laser energy to the stones that could not be irrigated free without fragmentation.  Settings were 30 Hz and 2.0 J.  Multiple smaller fragments resulted.  These were irrigated either through the cystoscope or the resectoscope sheath which was also used for drainage of the stones.  Following removal of all significant fragments, I utilized a [place]y syringe to irrigate small powder like fragments from the bladder.  All  significant fragments and sand-like fragments were removed with careful inspection showing the patient to be stone free.  There was no significant bladder wall injury.  Minimal hematuria was present.  The scope was removed.  I did not feel like Foley catheter placement needed to be performed.  He was then awakened, taken to the PACU in stable condition having tolerated the procedure well.

## 2021-03-22 NOTE — Interval H&P Note (Signed)
History and Physical Interval Note:  03/22/2021 8:19 AM  Jeffrey Rosario  has presented today for surgery, with the diagnosis of BLADDER STONE.  The various methods of treatment have been discussed with the patient and family. After consideration of risks, benefits and other options for treatment, the patient has consented to  Procedure(s) with comments: CYSTOSCOPY WITH LITHOLAPAXY (N/A) - 30 MINS HOLMIUM LASER APPLICATION (N/A) as a surgical intervention.  The patient's history has been reviewed, patient examined, no change in status, stable for surgery.  I have reviewed the patient's chart and labs.  Questions were answered to the patient's satisfaction.     Lillette Boxer Yarelli Decelles

## 2021-03-22 NOTE — Transfer of Care (Signed)
Immediate Anesthesia Transfer of Care Note  Patient: Jeffrey Rosario  Procedure(s) Performed: CYSTOSCOPY WITH  LITHOLAPAXY (Bladder) HOLMIUM LASER APPLICATION (Bladder)  Patient Location: PACU  Anesthesia Type:General  Level of Consciousness: drowsy and patient cooperative  Airway & Oxygen Therapy: Patient Spontanous Breathing and Patient connected to face mask oxygen  Post-op Assessment: Report given to RN and Post -op Vital signs reviewed and stable  Post vital signs: Reviewed and stable  Last Vitals:  Vitals Value Taken Time  BP 155/86 03/22/21 1005  Temp    Pulse 45 03/22/21 1007  Resp 9 03/22/21 1007  SpO2 100 % 03/22/21 1007  Vitals shown include unvalidated device data.  Last Pain:  Vitals:   03/22/21 0732  TempSrc: Oral  PainSc: 2       Patients Stated Pain Goal: 4 (03/40/35 2481)  Complications: No notable events documented.

## 2021-03-22 NOTE — Anesthesia Procedure Notes (Signed)
Procedure Name: LMA Insertion Date/Time: 03/22/2021 9:15 AM Performed by: Genelle Bal, CRNA Pre-anesthesia Checklist: Patient identified, Emergency Drugs available, Suction available and Patient being monitored Patient Re-evaluated:Patient Re-evaluated prior to induction Oxygen Delivery Method: Circle system utilized Preoxygenation: Pre-oxygenation with 100% oxygen Induction Type: IV induction Ventilation: Mask ventilation without difficulty LMA: LMA inserted LMA Size: 5.0 Number of attempts: 1 Airway Equipment and Method: Bite block Placement Confirmation: positive ETCO2 Tube secured with: Tape Dental Injury: Teeth and Oropharynx as per pre-operative assessment

## 2021-03-22 NOTE — Discharge Instructions (Addendum)
You may see some blood in the urine and may have some burning with urination for 48-72 hours. You also may notice that you have to urinate more frequently or urgently after your procedure which is normal.  You should call should you develop an inability urinate, fever > 101, persistent nausea and vomiting that prevents you from eating or drinking to stay hydrated.  If you have a stent, you will likely urinate more frequently and urgently until the stent is removed and you may experience some discomfort/pain in the lower abdomen and flank especially when urinating. You may take pain medication prescribed to you if needed for pain. You may also intermittently have blood in the urine until the stent is removed.  CYSTOSCOPY HOME CARE INSTRUCTIONS  Activity: Rest for the remainder of the day.  Do not drive or operate equipment today.  You may resume normal activities in one to two days as instructed by your physician.   Meals: Drink plenty of liquids and eat light foods such as gelatin or soup this evening.  You may return to a normal meal plan tomorrow.  Return to Work: You may return to work in one to two days or as instructed by your physician.  Special Instructions / Symptoms: Call your physician if any of these symptoms occur:   -persistent or heavy bleeding  -bleeding which continues after first few urination  -large blood clots that are difficult to pass  -urine stream diminishes or stops completely  -fever equal to or higher than 101 degrees Farenheit.  -cloudy urine with a strong, foul odor  -severe pain  Females should always wipe from front to back after elimination.  You may feel some burning pain when you urinate.  This should disappear with time.  Applying moist heat to the lower abdomen or a hot tub bath may help relieve the pain. \    Post Anesthesia Home Care Instructions  Activity: Get plenty of rest for the remainder of the day. A responsible individual must stay with  you for 24 hours following the procedure.  For the next 24 hours, DO NOT: -Drive a car -Operate machinery -Drink alcoholic beverages -Take any medication unless instructed by your physician -Make any legal decisions or sign important papers.  Meals: Start with liquid foods such as gelatin or soup. Progress to regular foods as tolerated. Avoid greasy, spicy, heavy foods. If nausea and/or vomiting occur, drink only clear liquids until the nausea and/or vomiting subsides. Call your physician if vomiting continues.  Special Instructions/Symptoms: Your throat may feel dry or sore from the anesthesia or the breathing tube placed in your throat during surgery. If this causes discomfort, gargle with warm salt water. The discomfort should disappear within 24 hours.    

## 2021-03-22 NOTE — Anesthesia Postprocedure Evaluation (Signed)
Anesthesia Post Note  Patient: Jeffrey Rosario  Procedure(s) Performed: CYSTOSCOPY WITH  LITHOLAPAXY (Bladder) HOLMIUM LASER APPLICATION (Bladder)     Patient location during evaluation: PACU Anesthesia Type: General Level of consciousness: awake and alert Pain management: pain level controlled Vital Signs Assessment: post-procedure vital signs reviewed and stable Respiratory status: spontaneous breathing, nonlabored ventilation, respiratory function stable and patient connected to nasal cannula oxygen Cardiovascular status: blood pressure returned to baseline and stable Postop Assessment: no apparent nausea or vomiting Anesthetic complications: no   No notable events documented.  Last Vitals:  Vitals:   03/22/21 1030 03/22/21 1045  BP: 139/85 (!) 156/93  Pulse: (!) 50 61  Resp: 12 12  Temp: (!) 36.3 C 36.5 C  SpO2: 100% 97%    Last Pain:  Vitals:   03/22/21 1045  TempSrc:   PainSc: 0-No pain                 Barnet Glasgow

## 2021-03-23 ENCOUNTER — Encounter (HOSPITAL_BASED_OUTPATIENT_CLINIC_OR_DEPARTMENT_OTHER): Payer: Self-pay | Admitting: Urology

## 2021-04-13 DIAGNOSIS — N401 Enlarged prostate with lower urinary tract symptoms: Secondary | ICD-10-CM | POA: Diagnosis not present

## 2021-04-13 DIAGNOSIS — N210 Calculus in bladder: Secondary | ICD-10-CM | POA: Diagnosis not present

## 2021-04-13 DIAGNOSIS — R3912 Poor urinary stream: Secondary | ICD-10-CM | POA: Diagnosis not present

## 2021-04-15 DIAGNOSIS — Z1152 Encounter for screening for COVID-19: Secondary | ICD-10-CM | POA: Diagnosis not present

## 2021-04-15 DIAGNOSIS — R8290 Unspecified abnormal findings in urine: Secondary | ICD-10-CM | POA: Diagnosis not present

## 2021-04-15 DIAGNOSIS — R051 Acute cough: Secondary | ICD-10-CM | POA: Diagnosis not present

## 2021-04-15 DIAGNOSIS — R5383 Other fatigue: Secondary | ICD-10-CM | POA: Diagnosis not present

## 2021-04-15 DIAGNOSIS — R300 Dysuria: Secondary | ICD-10-CM | POA: Diagnosis not present

## 2021-04-15 DIAGNOSIS — T781XXA Other adverse food reactions, not elsewhere classified, initial encounter: Secondary | ICD-10-CM | POA: Diagnosis not present

## 2021-04-15 DIAGNOSIS — Z7689 Persons encountering health services in other specified circumstances: Secondary | ICD-10-CM | POA: Diagnosis not present

## 2021-04-15 DIAGNOSIS — W57XXXA Bitten or stung by nonvenomous insect and other nonvenomous arthropods, initial encounter: Secondary | ICD-10-CM | POA: Diagnosis not present

## 2021-04-28 DIAGNOSIS — M4126 Other idiopathic scoliosis, lumbar region: Secondary | ICD-10-CM | POA: Diagnosis not present

## 2021-04-28 DIAGNOSIS — I10 Essential (primary) hypertension: Secondary | ICD-10-CM | POA: Diagnosis not present

## 2021-04-28 DIAGNOSIS — M4120 Other idiopathic scoliosis, site unspecified: Secondary | ICD-10-CM | POA: Diagnosis not present

## 2021-05-04 ENCOUNTER — Other Ambulatory Visit: Payer: Self-pay | Admitting: Neurological Surgery

## 2021-05-04 DIAGNOSIS — M4126 Other idiopathic scoliosis, lumbar region: Secondary | ICD-10-CM

## 2021-05-25 ENCOUNTER — Emergency Department (HOSPITAL_COMMUNITY)
Admission: EM | Admit: 2021-05-25 | Discharge: 2021-06-12 | Disposition: E | Payer: Medicare HMO | Attending: Emergency Medicine | Admitting: Emergency Medicine

## 2021-05-25 DIAGNOSIS — I469 Cardiac arrest, cause unspecified: Secondary | ICD-10-CM | POA: Diagnosis not present

## 2021-05-25 DIAGNOSIS — R0689 Other abnormalities of breathing: Secondary | ICD-10-CM | POA: Diagnosis not present

## 2021-05-25 DIAGNOSIS — R791 Abnormal coagulation profile: Secondary | ICD-10-CM | POA: Diagnosis not present

## 2021-05-25 DIAGNOSIS — Z79899 Other long term (current) drug therapy: Secondary | ICD-10-CM | POA: Diagnosis not present

## 2021-05-25 DIAGNOSIS — Z743 Need for continuous supervision: Secondary | ICD-10-CM | POA: Diagnosis not present

## 2021-05-25 DIAGNOSIS — I4901 Ventricular fibrillation: Secondary | ICD-10-CM | POA: Diagnosis not present

## 2021-05-25 DIAGNOSIS — R404 Transient alteration of awareness: Secondary | ICD-10-CM | POA: Diagnosis not present

## 2021-05-25 DIAGNOSIS — I499 Cardiac arrhythmia, unspecified: Secondary | ICD-10-CM | POA: Diagnosis not present

## 2021-05-25 DIAGNOSIS — R55 Syncope and collapse: Secondary | ICD-10-CM | POA: Diagnosis not present

## 2021-05-25 LAB — LIPID PANEL
Cholesterol: 170 mg/dL (ref 0–200)
HDL: 38 mg/dL — ABNORMAL LOW (ref >40)
LDL Cholesterol: 97 mg/dL (ref 0–99)
Total CHOL/HDL Ratio: 4.5 RATIO
Triglycerides: 176 mg/dL — ABNORMAL HIGH (ref <150)
VLDL: 35 mg/dL (ref 0–40)

## 2021-05-25 LAB — COMPREHENSIVE METABOLIC PANEL
ALT: 24 U/L (ref 0–44)
AST: 26 U/L (ref 15–41)
Albumin: 3.7 g/dL (ref 3.5–5.0)
Alkaline Phosphatase: 53 U/L (ref 38–126)
Anion gap: 20 — ABNORMAL HIGH (ref 5–15)
BUN: 21 mg/dL (ref 8–23)
CO2: 16 mmol/L — ABNORMAL LOW (ref 22–32)
Calcium: 9.5 mg/dL (ref 8.9–10.3)
Chloride: 106 mmol/L (ref 98–111)
Creatinine, Ser: 1.28 mg/dL — ABNORMAL HIGH (ref 0.61–1.24)
GFR, Estimated: >60 mL/min (ref >60)
Glucose, Bld: 195 mg/dL — ABNORMAL HIGH (ref 70–99)
Potassium: 4.8 mmol/L (ref 3.5–5.1)
Sodium: 142 mmol/L (ref 135–145)
Total Bilirubin: 0.9 mg/dL (ref 0.3–1.2)
Total Protein: 6.1 g/dL — ABNORMAL LOW (ref 6.5–8.1)

## 2021-05-25 LAB — CBC WITH DIFFERENTIAL/PLATELET
Abs Immature Granulocytes: 0.02 10*3/uL (ref 0.00–0.07)
Basophils Absolute: 0.1 10*3/uL (ref 0.0–0.1)
Basophils Relative: 1 %
Eosinophils Absolute: 0.5 10*3/uL (ref 0.0–0.5)
Eosinophils Relative: 4 %
HCT: 49.1 % (ref 39.0–52.0)
Hemoglobin: 14.7 g/dL (ref 13.0–17.0)
Immature Granulocytes: 0 %
Lymphocytes Relative: 67 %
Lymphs Abs: 8.9 10*3/uL — ABNORMAL HIGH (ref 0.7–4.0)
MCH: 29.8 pg (ref 26.0–34.0)
MCHC: 29.9 g/dL — ABNORMAL LOW (ref 30.0–36.0)
MCV: 99.6 fL (ref 80.0–100.0)
Monocytes Absolute: 1 10*3/uL (ref 0.1–1.0)
Monocytes Relative: 7 %
Neutro Abs: 2.8 10*3/uL (ref 1.7–7.7)
Neutrophils Relative %: 21 %
Platelets: 214 10*3/uL (ref 150–400)
RBC: 4.93 MIL/uL (ref 4.22–5.81)
RDW: 12.3 % (ref 11.5–15.5)
Smear Review: NORMAL
WBC: 13.3 10*3/uL — ABNORMAL HIGH (ref 4.0–10.5)
nRBC: 0 % (ref 0.0–0.2)

## 2021-05-25 LAB — TROPONIN I (HIGH SENSITIVITY): Troponin I (High Sensitivity): 528 ng/L (ref <18)

## 2021-05-25 LAB — HEMOGLOBIN A1C
Hgb A1c MFr Bld: 5.3 % (ref 4.8–5.6)
Mean Plasma Glucose: 105.41 mg/dL

## 2021-05-25 LAB — CBG MONITORING, ED: Glucose-Capillary: 92 mg/dL (ref 70–99)

## 2021-05-25 LAB — PROTIME-INR
INR: 1.1 (ref 0.8–1.2)
Prothrombin Time: 13.8 seconds (ref 11.4–15.2)

## 2021-05-25 LAB — APTT: aPTT: 26 seconds (ref 24–36)

## 2021-05-25 SURGERY — LEFT HEART CATH AND CORONARY ANGIOGRAPHY
Anesthesia: LOCAL

## 2021-05-25 MED ORDER — DEXTROSE 50 % IV SOLN
INTRAVENOUS | Status: AC | PRN
  Administered 2021-05-25: 1 via INTRAVENOUS

## 2021-05-25 MED ORDER — DEXTROSE 5 % IV SOLN
INTRAVENOUS | Status: AC | PRN
  Administered 2021-05-25: 150 mg via INTRAVENOUS
  Administered 2021-05-25: 300 mg via INTRAVENOUS

## 2021-05-25 MED ORDER — SODIUM CHLORIDE 0.9 % IV SOLN: INTRAVENOUS | Status: DC

## 2021-05-25 MED ORDER — SODIUM BICARBONATE 8.4 % IV SOLN
INTRAVENOUS | Status: AC | PRN
  Administered 2021-05-25 (×2): 100 meq via INTRAVENOUS

## 2021-05-25 MED ORDER — EPINEPHRINE 1 MG/10ML IJ SOSY
PREFILLED_SYRINGE | INTRAMUSCULAR | Status: AC | PRN
  Administered 2021-05-25 (×5): 1 mg via INTRAVENOUS

## 2021-05-25 MED ORDER — IPRATROPIUM-ALBUTEROL 0.5-2.5 (3) MG/3ML IN SOLN: 3.0000 mL | Freq: Once | RESPIRATORY_TRACT | Status: DC

## 2021-05-25 MED ORDER — IPRATROPIUM-ALBUTEROL 0.5-2.5 (3) MG/3ML IN SOLN
RESPIRATORY_TRACT | Status: AC
  Filled 2021-05-25: qty 3

## 2021-05-25 MED ORDER — AMIODARONE HCL 150 MG/3ML IV SOLN
INTRAVENOUS | Status: AC | PRN
  Administered 2021-05-25: 150 mg via INTRAVENOUS

## 2021-05-25 MED ORDER — EPINEPHRINE 1 MG/10ML IJ SOSY
PREFILLED_SYRINGE | INTRAMUSCULAR | Status: DC | PRN
  Administered 2021-05-25 (×2): 1 mg via INTRAVENOUS

## 2021-05-25 MED ORDER — ASPIRIN 81 MG PO CHEW: 324.0000 mg | CHEWABLE_TABLET | Freq: Once | ORAL | Status: DC

## 2021-05-25 MED ORDER — CALCIUM CHLORIDE 10 % IV SOLN
INTRAVENOUS | Status: AC | PRN
  Administered 2021-05-25 (×2): 1 g via INTRAVENOUS

## 2021-05-25 MED ORDER — HEPARIN SODIUM (PORCINE) 5000 UNIT/ML IJ SOLN: 60.0000 [IU]/kg | Freq: Once | INTRAMUSCULAR | Status: DC

## 2021-05-25 MED ORDER — NALOXONE HCL 2 MG/2ML IJ SOSY
PREFILLED_SYRINGE | INTRAMUSCULAR | Status: AC | PRN
  Administered 2021-05-25: 2 mg via INTRAVENOUS

## 2021-05-26 ENCOUNTER — Other Ambulatory Visit: Payer: Medicare HMO

## 2021-06-01 LAB — I-STAT CHEM 8, ED
BUN: 20 mg/dL (ref 8–23)
Calcium, Ion: >2.5 mmol/L (ref 1.15–1.40)
Chloride: 107 mmol/L (ref 98–111)
Creatinine, Ser: 1.2 mg/dL (ref 0.61–1.24)
Glucose, Bld: >700 mg/dL (ref 70–99)
HCT: 38 % — ABNORMAL LOW (ref 39.0–52.0)
Hemoglobin: 12.9 g/dL — ABNORMAL LOW (ref 13.0–17.0)
Potassium: 4.8 mmol/L (ref 3.5–5.1)
Sodium: 143 mmol/L (ref 135–145)
TCO2: 28 mmol/L (ref 22–32)
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
## 2021-06-12 NOTE — ED Notes (Signed)
$'100mg'G$  lido

## 2021-06-12 NOTE — Progress Notes (Signed)
Responded to CPR page to support pt. And staff.  Pt. Passed;Jeffrey Rosario

## 2021-06-12 NOTE — Code Documentation (Signed)
Family updated as to patient's status by Sabra Heck, MD.

## 2021-06-12 NOTE — Progress Notes (Signed)
Son arrived and was escorted to family consultation room to speak with Doctor. Family was escorted to bedside to visit with patient.  Provided emotional and spiritual support.     Jaclynn Major, [place], [facility], Pager [phone]

## 2021-06-12 NOTE — Code Documentation (Signed)
Organ procurement team notified.

## 2021-06-12 NOTE — Code Documentation (Signed)
Patient time of death occurred at 57.

## 2021-06-12 NOTE — ED Notes (Signed)
Update [place] [facility] with [place] information when present.  Referral Number [id number] Suitable for tissue & eyes
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
## 2021-06-12 DEATH — deceased
